# Patient Record
Sex: Female | Born: 1990 | Race: Asian | Hispanic: No | Marital: Married | Smoking: Never smoker
Health system: Southern US, Community
[De-identification: ages and names within clinical notes are randomized; demographics above are authoritative.]

## PROBLEM LIST (undated history)

## (undated) ENCOUNTER — Inpatient Hospital Stay: Payer: Self-pay

## (undated) DIAGNOSIS — K219 Gastro-esophageal reflux disease without esophagitis: Secondary | ICD-10-CM

## (undated) DIAGNOSIS — Z789 Other specified health status: Secondary | ICD-10-CM

## (undated) HISTORY — DX: Other specified health status: Z78.9

---
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
## 2014-09-03 ENCOUNTER — Encounter: Payer: Self-pay | Admitting: Maternal & Fetal Medicine

## 2014-11-01 ENCOUNTER — Inpatient Hospital Stay
Admit: 2014-11-01 | Disposition: A | Discharge status: Home / Self Care | Payer: Self-pay | Attending: Obstetrics and Gynecology | Admitting: Obstetrics and Gynecology

## 2014-11-01 LAB — CBC WITH DIFFERENTIAL/PLATELET
BASOS ABS: 0 10*3/uL (ref 0.0–0.1)
Basophil %: 0.3 %
EOS ABS: 0.2 10*3/uL (ref 0.0–0.7)
Eosinophil %: 1.5 %
HCT: 38.4 % (ref 35.0–47.0)
HGB: 13.1 g/dL (ref 12.0–16.0)
Lymphocyte #: 2.3 10*3/uL (ref 1.0–3.6)
Lymphocyte %: 22.3 %
MCH: 28.9 pg (ref 26.0–34.0)
MCHC: 34 g/dL (ref 32.0–36.0)
MCV: 85 fL (ref 80–100)
Monocyte #: 0.7 x10 3/mm (ref 0.2–0.9)
Monocyte %: 7.2 %
Neutrophil #: 7 10*3/uL — ABNORMAL HIGH (ref 1.4–6.5)
Neutrophil %: 68.7 %
PLATELETS: 253 10*3/uL (ref 150–440)
RBC: 4.51 10*6/uL (ref 3.80–5.20)
RDW: 12.9 % (ref 11.5–14.5)
WBC: 10.1 10*3/uL (ref 3.6–11.0)

## 2014-11-01 LAB — GC/CHLAMYDIA PROBE AMP

## 2014-11-02 LAB — HEMATOCRIT: HCT: 34.1 % — AB (ref 35.0–47.0)

## 2014-11-08 NOTE — Op Note (Addendum)
PATIENT NAME:  Sabrina Little, Sabrina Little MR#:  [id number] DATE OF BIRTH:  07/06/1991 VISIT#:  [id number].  DATE OF PROCEDURE:  11/01/2014.  PREOPERATIVE DIAGNOSES:  1. Intrauterine pregnancy at 40-0/7 weeks' gestational age.  2. History of prior cesarean delivery.  3. Nonreassuring antenatal testing.    POSTOPERATIVE DIAGNOSES:   1. Intrauterine pregnancy at 40-0/7 weeks' gestational age.  2. History of prior cesarean delivery.  3. Nonreassuring antenatal testing.    PROCEDURE PERFORMED:  Repeat low transverse cesarean section via Pfannenstiel incision.   ANESTHESIA:  Spinal.   SURGEON:  Conard Novak, MD.   ESTIMATED BLOOD LOSS:  750 mL.   OPERATIVE FLUIDS:  1400 mL of crystalloid.   COMPLICATIONS:  None.   FINDINGS:  1. Dense adhesions of right anterior uterus to right abdominal wall and right bladder.  2. Filmy adhesions of bladder to lower uterine segment.  3. Otherwise normal-appearing gravid uterus, fallopian tubes, and ovaries.  4. Viable female infant with Apgars of 8 at 1 minute and 9 at 5 minutes with a weight of 3580 grams.   SPECIMENS:  None.   CONDITION AT THE END OF PROCEDURE:  Stable.   PROCEDURE IN DETAIL:  The patient was taken to the operating room where spinal analgesia was administered and found to be adequate.  The patient was placed in the dorsal supine position with a leftward tilt and prepped and draped in the usual sterile fashion.  After a timeout was called, a Pfannenstiel incision was made through a prior incision scar and carried through the various layers until the peritoneum was identified and opened sharply.  The peritoneal opening was extended; however, it was noticed that the right lower anterior aspect of the uterus was adherent to both the overlying abdominal wall as well as the right segment of the bladder in a fairly dense fashion.  There was also noted to be some filmy adhesions of the bladder to the lower uterine segment which were taken down with  careful dissection using Metzenbaum scissors.  After the bladder flap was created, a low transverse hysterotomy was made with a scalpel bearing just left of the adhesions to the right side of the bladder and was extended laterally with cranial and caudal tension.  The fetal vertex was grasped and elevated to the hysterotomy and delivered with fundal pressure followed by the shoulders and the rest of the body.  Given the size of the opening had to be somewhat reduced, the right arm had to be delivered prior to delivery of the left shoulder in order to deliver the shoulders without excessive traction on the fetal neck.  After delivery of both shoulders, the rest of the body followed without difficulty.  The cord was clamped and cut, and the infant was handed to the pediatricians.  The placenta was removed spontaneously intact with a 3-vessel cord.  The uterus was then able to be exteriorized and cleared of all clots and debris.  The hysterotomy was closed using #0 Vicryl in a running locked fashion.  A second layer of the same suture was used in an imbricating fashion to obtain hemostasis.  The uterus was returned to the abdomen, and the gutters were cleared of all clots and debris.  The rectus muscles were gently reapproximated using a horizontal mattress stitch.  After verification of hemostasis on the rectus muscles, the On-Q catheters were placed.   The catheters were placed according to the manufacturer's recommendations.  They were inserted approximately 4 cm cephalad to the  incision line, approximately 1 cm apart, straddling the midline.  They were inserted to a depth of approximately the fourth marking on the catheters. They were positioned just superficial to the rectus abdominis muscles and just deep to the rectus fascia.   The rectus fascia was closed using #1-0 looped PDS in a running fashion.  Great care was taken to exclude the On-Q catheters from the closure.  The subcutaneous tissue was then  irrigated and hemostasis was noted.  Subcutaneous tissue was closed using #2-0 plain gut such that no greater than 2 cm of dead space remained.  The skin was closed using 4-0 Monocryl in a subcuticular fashion.  This closure was reinforced using Dermabond.   The On-Q catheters were affixed to the skin using Dermabond as well as Steri-Strips and Tegaderm.  Each catheter was bolused with 5 mL of 0.5% Marcaine plain for a total of 10 mL.   The patient tolerated the procedure well.  Sponge, lap, and needle counts were correct x 2.  For antibiotic prophylaxis, she received 2 grams of Ancef prior to skin incision.  For VTE prophylaxis, she was wearing pneumatic compression stockings which were on and operating throughout the entire procedure.  She was taken to the recovery area at the end of the procedure in stable condition.     ____________________________ Conard NovakStephen D. Shivonne Schwartzman, MD sdj:kc D: 11/01/2014 18:18:19 ET T: 11/01/2014 19:25:47 ET JOB#: [id number]  cc: Conard NovakStephen D. Takumi Din, MD, <Dictator> Conard NovakSTEPHEN D Slyvia Lartigue MD ELECTRONICALLY SIGNED 11/07/2014 17:57

## 2014-11-08 NOTE — Consult Note (Signed)
Referral Information:  Reason for Referral 24 yo gravida 3 para 1011 at 6045w4d gestation by LMP and 12-13w US is referred for discussion of risks of uterine rupture with TOLAC.   Referring Physician [facility]   Prenatal Hx Uncomplicated   Past Obstetrical Hx 08/18/2012 - Documented LTCS with 2 cm R inferior extension.  7 lb 13.2 oz female infant.  Repaired with #1 chromic running locked suture.  Indication was nonreassuring FHR.  Was 4-5 cm.   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   Occupation Father Works in [employer]   Soc Hx married, No substances   Review Of Systems:  Subjective Negative   Fever/Chills No   Cough No   Abdominal Pain No   Diarrhea No   Constipation No   Nausea/Vomiting No   SOB/DOE No   Chest Pain No   Dysuria No   Tolerating Diet Yes   Medications/Allergies Reviewed Medications/Allergies reviewed   Exam:  Today's Weight Ht 5'2"-196lb    Additional Lab/Radiology Notes BMI = 36   Impression/Recommendations:  Impression 24 yo gravida 3 para 1011 at 7345w4d gestation by LMP and 12-13w US with: Obesity (BMI = 36) History of LTCS for NRFHT at 4-5 cm - incision closed with (single) running locked suture   Recommendations The patient was counseled that:  1.  Current evidence  is insufficient to conclude about the risk of uterine rupture based on the  history of a single-layer or double-layer closure. Single-layer closure and locked first layer are possibly coupled with thinner residual myometrium thickness.  Current evidence based on randomized trials does not support a specific type of uterine closure for optimal maternal outcomes.  Carney Harder(Roberge et al American Journal of Obstetrics and Gynecology, Volume 212, Issue 6, June 2015, Page 829).  The patient was counseled that her overall risk of uterine rupture is equal to or less than 1%. 2. Using the NIH Maternal-Fetal Medicine Units Network VBAC Calculator, the patient has a 2 out of 3 chance of a successful VBAC (68.5% [64.9-72.0%]) 3.  As to the length of gestation, we would recommend she be delivered no later than [redacted] weeks gestation - our recommendation for all patients without significant additional risk factors. 4.  Given her history of cesarean delivery and her obesity, I would suggest an ultrasound for growth at [redacted] weeks gestation. 5.  We can concur that she should not undergo induction of labor (unless by ROM).   Plan:  Ultrasound at what gestational ages 8036 weeks gestation   Delivery at what gestational age [redacted] weeks if undelivered.   Comment/Plan Thank you for allowing us to participate in her care.    Total Time Spent with Patient 45 minutes   >50% of visit spent in couseling/coordination of care yes   Office Use Only 99243  Level 3 (40min)  NEW office consult detailed   Coding Description: MATERNAL CONDITIONS/HISTORY INDICATION(S).   Previous C-section.  Electronic Signatures: Lady Deutscher (MD)  (Signed 03-Mar-16 13:05)  Authored: Referral, Home Medications, Vital Signs/Notes, Consult, Exam, Lab/Radiology Notes, Impression, Plan, Billing, Coding Description   Last Updated: 03-Mar-16 13:05 by Lady Deutscher (MD)

## 2014-11-17 NOTE — H&P (Signed)
L&D Evaluation:  History Expanded:  HPI 24 yo G3P1011 at 3342w0d gestational age by LMP consistent with 12-13 week ultrasound. Her pregnancy is complicated by a history of prior emergent [place].  She has been counseled extensively regarding attempted VBAC versus repeat cesarean delivery. [facility] and [facility].  She had elected to attempt VBAC unless no labor by 441 week gestational age, at which time she is scheduled to undergo a repeat cesarean delivery.  She presented today with regular uterine contractions becoming stronger.  She notes positive fetal movement, no leakage of fluid, and no vaginal bleeding.  While being observed during a labor evalution, she had a spontaneous deceleration lasting several minutes with gradual return to baseline.   Blood Type (Maternal) A positive   Group B Strep Results Maternal (Result >5wks must be treated as unknown) positive   Maternal HIV Negative   Maternal Syphilis Ab Nonreactive   Maternal Varicella Immune   Rubella Results (Maternal) immune   [facility]   Patient's Medical History No Chronic Illness   Patient's Surgical History Previous C-Section  Tonsillectomy   Medications Pre Natal Vitamins   Allergies NKDA   Social History none  denies tobacco, EtOH, and illicit drug use   Family History Non-Contributory   ROS:  ROS All systems were reviewed.  HEENT, CNS, GI, GU, Respiratory, CV, Renal and Musculoskeletal systems were found to be normal., unless otherwise noted in HPI   Exam:  Vital Signs T36.9C, P94, BP 108/56, RR 16   General no apparent distress   Mental Status clear   Chest clear   Heart normal sinus rhythm   Abdomen gravid, non-tender   Estimated Fetal Weight 8 pounds   Fetal Position cephalic   Back no CVAT   Edema no edema   Pelvic no external lesions, 1cm per RN   Mebranes Intact   FHT 125/mod var/+accels/+DECEL to 60s with 3 min from baseline  to baseline   Ucx irregular   Skin no lesions   Impression:  Impression 1) Intrauterine pregnancy at 2942w0d gestational age, 2) history of prior cesarean delivery, 3) non-reassuring antenatal testing with a positive spontaneous contraction stress test   Plan:  Comments 1) Labor: Given non-reassuring antenatal testing with deceleration to 60s, had a long discussion with the patient and her husband regarding ongoing management. Discussed risks of continuing with attempted labor (thoug she is not laboring now) versus a repeat cesarean delivery.  Mutual decision made to proceed with repeat cesarean delivery given fetal tracing.   2) Fetus - category I tracing  currently, but guarded given prolonged deceleration in setting of prior cesarean delivery.  3) PNL A positive / ABSC negative / RI / VZI / HIV neg / RPR NR / HBsAg neg / 1st trimester screen negative / passed early 1h gtt,  passed 28 wk 3h gtt with one abnormal / GBS positive / total weight gain this pregnancy = 14lb 4) TDAP  given 08/20/14 5) Disposition - home postpartum   Electronic Signatures: Conard NovakJackson, Ibraheem Voris D (MD)  (Signed 24-Apr-16 16:06)  Authored: L&D Evaluation   Last Updated: 24-Apr-16 16:06 by Conard NovakJackson, Mahamadou Weltz D (MD)

## 2015-07-11 NOTE — L&D Delivery Note (Signed)
Delivery Note At  2320 a viable and but floppy female "Maverick" was delivered via  (Presentation:ROP ;  ).  APGAR:1 ,8 ; weight  .  Cord cut immediatley and infant to warmer t for stimulation PPV started at 90 secounds as HR down to 60s with gradual response over 90 seconds, grimacing and breathing efforts noted, color improved with O2. Neonatology in at 4 minute and assumed care. Terminal meconium. Placenta status: delivered intact with 3 vessel  Cord:  with the following complications: early variable with third stage of labor  Cord pH: awaiting  Anesthesia:  epidural Episiotomy:  none Lacerations:  partial 3rd Suture Repair: 3.0 vicryl rapide Est. Blood Loss (mL):  300  Mom to postpartum.  Baby to Nursery. For transition and observation  [employer] MicrosystemsMelody N Shambley, CNM 05/12/2016, 11:44 PM

## 2015-11-17 ENCOUNTER — Encounter: Payer: Self-pay | Admitting: Obstetrics and Gynecology

## 2015-11-17 ENCOUNTER — Ambulatory Visit (INDEPENDENT_AMBULATORY_CARE_PROVIDER_SITE_OTHER): Payer: Medicaid Other | Admitting: Obstetrics and Gynecology

## 2015-11-17 VITALS — BP 104/63 | HR 79 | Ht 62.0 in | Wt 186.6 lb

## 2015-11-17 DIAGNOSIS — Z98891 History of uterine scar from previous surgery: Secondary | ICD-10-CM

## 2015-11-17 DIAGNOSIS — O219 Vomiting of pregnancy, unspecified: Secondary | ICD-10-CM

## 2015-11-17 DIAGNOSIS — Z3492 Encounter for supervision of normal pregnancy, unspecified, second trimester: Secondary | ICD-10-CM | POA: Diagnosis not present

## 2015-11-17 DIAGNOSIS — R638 Other symptoms and signs concerning food and fluid intake: Secondary | ICD-10-CM

## 2015-11-17 DIAGNOSIS — Z8759 Personal history of other complications of pregnancy, childbirth and the puerperium: Secondary | ICD-10-CM

## 2015-11-17 MED ORDER — PROMETHAZINE HCL 25 MG PO TABS: 25.0000 mg | ORAL_TABLET | Freq: Four times a day (QID) | ORAL | Status: DC | PRN

## 2015-11-17 NOTE — Progress Notes (Signed)
NEW OB HISTORY AND PHYSICAL  SUBJECTIVE:       Sabrina Little is a 25 y.o. [phone] female, Patient's last menstrual period was 07/31/2015 (exact date)., Estimated Date of Delivery: 05/06/16, [redacted]w[redacted]d, presents today for establishment of Prenatal Care. She has no unusual complaints and complains of headache, nausea with vomiting, sciatic pain on the right side, and dizziness.  Patient is currently taking prenatal vitamins, was not at time of conception.    Gynecologic History Patient's last menstrual period was 07/31/2015 (exact date). Normal Contraception: none Last Pap: 2015. Results were: normal  Obstetric History OB History  Gravida Para Term Preterm AB SAB TAB Ectopic Multiple Living  # Outcome Date GA Lbr Len/2nd Weight Sex Delivery Anes PTL Lv  5 Current           4 Term 2016   7 lb 2.2 oz (3.239 kg) M CS-LTranv   Y  3 SAB 2015          2 Term 2014   7 lb 2.1 oz (3.234 kg) M CS-LTranv   Y  1 SAB 2013              Past Medical History  Diagnosis Date  . Medical history non-contributory     Past Surgical History  Procedure Laterality Date  . Cesarean section      x2  . Tonsillectomy  2009    No current outpatient prescriptions on file prior to visit.   No current facility-administered medications on file prior to visit.    No Known Allergies  Social History   Social History  . Marital Status: Married    Spouse Name: N/A  . Number of Children: N/A  . Years of Education: N/A   Occupational History  . Not on file.   Social History Main Topics  . Smoking status: Never Smoker   . Smokeless tobacco: Not on file  . Alcohol Use: No  . Drug Use: No  . Sexual Activity: Yes    Birth Control/ Protection: None   Other Topics Concern  . Not on file   Social History Narrative  . No narrative on file    Family History  Problem Relation Age of Onset  . Cancer Neg Hx   . Diabetes Neg Hx   . Heart disease Neg Hx     The following  portions of the patient's history were reviewed and updated as appropriate: allergies, current medications, past OB history, past medical history, past surgical history, past family history, past social history, and problem list.    OBJECTIVE: Initial Physical Exam (New OB)  GENERAL APPEARANCE: alert, well appearing, in no apparent distress, overweight HEAD: normocephalic, atraumatic MOUTH: mucous membranes moist, pharynx normal without lesions THYROID: no thyromegaly or masses present BREASTS: no masses noted, no significant tenderness, no palpable axillary nodes, no skin changes LUNGS: clear to auscultation, no wheezes, rales or rhonchi, symmetric air entry HEART: regular rate and rhythm, no murmurs ABDOMEN: soft, nontender, nondistended, no abnormal masses, no epigastric pain, obese, fundus soft, nontender 17 weeks size and FHT found on [place] to be 153 bpm EXTREMITIES: no redness or tenderness in the calves or thighs, no edema, no limitation in range of motion, intact peripheral pulses SKIN: normal coloration and turgor, no rashes LYMPH NODES: no adenopathy palpable NEUROLOGIC: alert, oriented, normal speech, no focal findings or movement disorder noted  PELVIC EXAM EXTERNAL GENITALIA: normal appearing vulva  with no masses, tenderness or lesions VAGINA: discharge white in color CERVIX: no lesions or cervical motion tenderness and closed UTERUS: gravid, consistent with 17 weeks and anteverted ADNEXA: no masses palpable and nontender OB EXAM PELVIMETRY: NA  ASSESSMENT: Normal pregnancy 15.4 weeks Increased BMI Prior C/S x 2 N/V of pregnancy Current risk factors include: BMI of 34, prior cesarean x2  PLAN: 1. US to find FHT today - FHT 153 bpm 2. Pap done today. 3. Prenatal Labs. 4. Early glucola screening due to obesity. 5. Schedule anatomy scan for 19-20 weeks. 6. Phenergan for nausea. 7. New OB counseling: The patient has been given an overview regarding routine prenatal  care. Recommendations regarding diet, weight gain, and exercise in pregnancy were given. Prenatal testing, optional genetic testing, and ultrasound use in pregnancy were reviewed.  Benefits of Breast Feeding were discussed. The patient is encouraged to consider nursing her baby post partum.   Tresa EndoKelly Rayburn PA-S Herold HarmsMartin A Laycie Schriner, MD   I have seen, interviewed, and examined the patient in conjunction with the [employer] student and affirm the diagnosis and management plan. Jayelyn Barno A. Yahya Boldman, MD, FACOG   Note: This dictation was prepared with Dragon dictation along with smaller phrase technology. Any transcriptional errors that result from this process are unintentional.

## 2015-11-18 ENCOUNTER — Telehealth: Payer: Self-pay

## 2015-11-18 ENCOUNTER — Encounter: Payer: Self-pay | Admitting: Obstetrics and Gynecology

## 2015-11-18 DIAGNOSIS — O99810 Abnormal glucose complicating pregnancy: Secondary | ICD-10-CM | POA: Insufficient documentation

## 2015-11-18 DIAGNOSIS — R7309 Other abnormal glucose: Secondary | ICD-10-CM

## 2015-11-18 LAB — ABO AND RH: RH TYPE: POSITIVE

## 2015-11-18 LAB — GLUCOSE, 1 HOUR GESTATIONAL: GESTATIONAL DIABETES SCREEN: 160 mg/dL — AB (ref 65–139)

## 2015-11-18 LAB — CBC WITH DIFFERENTIAL/PLATELET
BASOS: 0 %
Basophils Absolute: 0 10*3/uL (ref 0.0–0.2)
EOS (ABSOLUTE): 0.1 10*3/uL (ref 0.0–0.4)
Eos: 2 %
Hematocrit: 37.1 % (ref 34.0–46.6)
Hemoglobin: 12.6 g/dL (ref 11.1–15.9)
IMMATURE GRANULOCYTES: 0 %
Immature Grans (Abs): 0 10*3/uL (ref 0.0–0.1)
LYMPHS ABS: 1.5 10*3/uL (ref 0.7–3.1)
Lymphs: 21 %
MCH: 28.3 pg (ref 26.6–33.0)
MCHC: 34 g/dL (ref 31.5–35.7)
MCV: 83 fL (ref 79–97)
MONOS ABS: 0.3 10*3/uL (ref 0.1–0.9)
Monocytes: 4 %
NEUTROS PCT: 73 %
Neutrophils Absolute: 5.3 10*3/uL (ref 1.4–7.0)
PLATELETS: 223 10*3/uL (ref 150–379)
RBC: 4.45 x10E6/uL (ref 3.77–5.28)
RDW: 13.6 % (ref 12.3–15.4)
WBC: 7.3 10*3/uL (ref 3.4–10.8)

## 2015-11-18 LAB — VARICELLA ZOSTER ANTIBODY, IGG: VARICELLA: 620 {index} (ref >165)

## 2015-11-18 LAB — HEPATITIS B SURFACE ANTIGEN: Hepatitis B Surface Ag: NEGATIVE

## 2015-11-18 LAB — RUBELLA SCREEN: Rubella Antibodies, IGG: 4.67 index (ref >0.99)

## 2015-11-18 LAB — TOXOPLASMA ANTIBODIES- IGG AND  IGM: Toxoplasma Antibody- IgM: <3 AU/mL (ref 0.0–7.9)

## 2015-11-18 LAB — ANTIBODY SCREEN: Antibody Screen: NEGATIVE

## 2015-11-18 LAB — RPR: RPR Ser Ql: NONREACTIVE

## 2015-11-18 LAB — HIV ANTIBODY (ROUTINE TESTING W REFLEX): HIV Screen 4th Generation wRfx: NONREACTIVE

## 2015-11-18 NOTE — Telephone Encounter (Signed)
-----   Message from Herold HarmsMartin A Defrancesco, MD sent at 11/18/2015  8:12 AM EDT ----- Please notify - Abnormal Labs 1 hour Glucola elevated at 160; please schedule 3 hour GTT

## 2015-11-18 NOTE — Telephone Encounter (Signed)
Pt aware. 3 hour scheduled for 5/22 8am. Pt aware to be fasting.

## 2015-11-19 LAB — MONITOR DRUG PROFILE 14(MW)
Amphetamine Scrn, Ur: NEGATIVE ng/mL
BARBITURATE SCREEN URINE: NEGATIVE ng/mL
BENZODIAZEPINE SCREEN, URINE: NEGATIVE ng/mL
BUPRENORPHINE, URINE: NEGATIVE ng/mL
CANNABINOIDS UR QL SCN: NEGATIVE ng/mL
COCAINE(METAB.)SCREEN, URINE: NEGATIVE ng/mL
CREATININE(CRT), U: 47.5 mg/dL (ref 20.0–300.0)
Fentanyl, Urine: NEGATIVE pg/mL
MEPERIDINE SCREEN, URINE: NEGATIVE ng/mL
Methadone Screen, Urine: NEGATIVE ng/mL
OXYCODONE+OXYMORPHONE UR QL SCN: NEGATIVE ng/mL
Opiate Scrn, Ur: NEGATIVE ng/mL
PHENCYCLIDINE QUANTITATIVE URINE: NEGATIVE ng/mL
Ph of Urine: 5.9 (ref 4.5–8.9)
Propoxyphene Scrn, Ur: NEGATIVE ng/mL
SPECIFIC GRAVITY: 1.014
Tramadol Screen, Urine: NEGATIVE ng/mL

## 2015-11-19 LAB — URINALYSIS, ROUTINE W REFLEX MICROSCOPIC
Bilirubin, UA: NEGATIVE
GLUCOSE, UA: NEGATIVE
Ketones, UA: NEGATIVE
Leukocytes, UA: NEGATIVE
NITRITE UA: NEGATIVE
Protein, UA: NEGATIVE
RBC, UA: NEGATIVE
Specific Gravity, UA: 1.012 (ref 1.005–1.030)
Urobilinogen, Ur: 0.2 mg/dL (ref 0.2–1.0)
pH, UA: 6 (ref 5.0–7.5)

## 2015-11-19 LAB — NICOTINE SCREEN, URINE: Cotinine Ql Scrn, Ur: NEGATIVE ng/mL

## 2015-11-20 LAB — PAP IG, CT-NG, RFX HPV ASCU
Chlamydia, Nuc. Acid Amp: NEGATIVE
Gonococcus by Nucleic Acid Amp: NEGATIVE
PAP SMEAR COMMENT: 0

## 2015-11-22 LAB — CULTURE, OB URINE

## 2015-11-22 LAB — URINE CULTURE, OB REFLEX

## 2015-11-24 ENCOUNTER — Other Ambulatory Visit: Payer: Medicaid Other

## 2015-11-24 ENCOUNTER — Other Ambulatory Visit: Payer: Self-pay | Admitting: Obstetrics and Gynecology

## 2015-11-26 ENCOUNTER — Encounter: Payer: Self-pay | Admitting: Obstetrics and Gynecology

## 2015-11-26 DIAGNOSIS — R8271 Bacteriuria: Secondary | ICD-10-CM | POA: Insufficient documentation

## 2015-11-27 LAB — AFP, QUAD SCREEN
DIA MOM VALUE: 0.91
DIA Value (EIA): 141.87 pg/mL
DSR (By Age)    1 IN: 1094
DSR (SECOND TRIMESTER) 1 IN: 4243
Gestational Age: 16.4 WEEKS
MSAFP Mom: 0.86
MSAFP: 25.9 ng/mL
MSHCG Mom: 2.27
MSHCG: 74376 m[IU]/mL
Maternal Age At EDD: 23.3 YEARS
Osb Risk: 10000
T18 (By Age): 1:4262 {titer}
Test Results:: NEGATIVE
WEIGHT: 186 [lb_av]
uE3 Mom: 0.86
uE3 Value: 0.74 ng/mL

## 2015-11-29 ENCOUNTER — Other Ambulatory Visit: Payer: Medicaid Other

## 2015-11-29 ENCOUNTER — Telehealth: Payer: Self-pay

## 2015-11-29 NOTE — Telephone Encounter (Signed)
Pt aware.

## 2015-11-29 NOTE — Telephone Encounter (Signed)
-----   Message from Herold HarmsMartin A Defrancesco, MD sent at 11/29/2015 10:41 AM EDT ----- Please Notify - Labs normal Test is negative for open spina bifida, Down syndrome, and trisomy 18

## 2015-12-13 ENCOUNTER — Other Ambulatory Visit: Payer: Medicaid Other

## 2015-12-14 ENCOUNTER — Ambulatory Visit (INDEPENDENT_AMBULATORY_CARE_PROVIDER_SITE_OTHER): Payer: Medicaid Other | Admitting: Obstetrics and Gynecology

## 2015-12-14 ENCOUNTER — Encounter: Payer: Self-pay | Admitting: Obstetrics and Gynecology

## 2015-12-14 ENCOUNTER — Ambulatory Visit (INDEPENDENT_AMBULATORY_CARE_PROVIDER_SITE_OTHER): Payer: Medicaid Other

## 2015-12-14 VITALS — BP 109/68 | HR 75 | Wt 185.2 lb

## 2015-12-14 DIAGNOSIS — Z8759 Personal history of other complications of pregnancy, childbirth and the puerperium: Secondary | ICD-10-CM | POA: Diagnosis not present

## 2015-12-14 DIAGNOSIS — Z1389 Encounter for screening for other disorder: Secondary | ICD-10-CM

## 2015-12-14 DIAGNOSIS — Z369 Encounter for antenatal screening, unspecified: Secondary | ICD-10-CM

## 2015-12-14 DIAGNOSIS — Z36 Encounter for antenatal screening of mother: Secondary | ICD-10-CM

## 2015-12-14 DIAGNOSIS — Z98891 History of uterine scar from previous surgery: Secondary | ICD-10-CM

## 2015-12-14 DIAGNOSIS — Z3492 Encounter for supervision of normal pregnancy, unspecified, second trimester: Secondary | ICD-10-CM

## 2015-12-14 DIAGNOSIS — M5431 Sciatica, right side: Secondary | ICD-10-CM

## 2015-12-14 DIAGNOSIS — M5430 Sciatica, unspecified side: Secondary | ICD-10-CM | POA: Insufficient documentation

## 2015-12-14 LAB — POCT URINALYSIS DIPSTICK
Bilirubin, UA: NEGATIVE
Blood, UA: NEGATIVE
Glucose, UA: NEGATIVE
KETONES UA: NEGATIVE
LEUKOCYTES UA: NEGATIVE
Nitrite, UA: NEGATIVE
PH UA: 6
PROTEIN UA: NEGATIVE
SPEC GRAV UA: 1.01
Urobilinogen, UA: NEGATIVE

## 2015-12-14 NOTE — Progress Notes (Signed)
Indications:Anatomy U/S Findings:  Singleton intrauterine pregnancy is visualized with FHR at 157 BPM. Biometrics give an (U/S) Gestational age of 23 4/7 weeks and an (U/S) EDD of 05/12/16; this correlates with the clinically established EDD of 05/06/16.  Fetal presentation is Vertex.  EFW: 245g (9oz). Placenta: posterior plac, grade0, remote to cervix. AFI: Adequate with MVP of 4.1cm.  Anatomic survey is complete and normal; Gender - female  .   Right Ovary measures 3.1 x 1.5 x 2 cm. It is normal in appearance. Left Ovary measures 2.2 x 1 x 1.6 cm. It is normal appearance. There is evidence of a corpus luteal cyst in the Right ovary. Survey of the adnexa demonstrates no adnexal masses. There is no free peritoneal fluid in the cul de sac.  Impression: 1. 18 4/7 week Viable Singleton Intrauterine pregnancy by U/S. 2. (U/S) EDD is consistent with Clinically established (LMP) EDD of 05/06/16. 3. Normal Anatomy Scan  C/O headaches- OK to try tylenol with caffeine; also having right sciatic pains- recommend chiropractor evaluation

## 2015-12-17 ENCOUNTER — Encounter: Payer: Self-pay | Admitting: Obstetrics and Gynecology

## 2015-12-17 ENCOUNTER — Other Ambulatory Visit: Payer: Medicaid Other

## 2015-12-17 DIAGNOSIS — R7309 Other abnormal glucose: Secondary | ICD-10-CM

## 2015-12-18 LAB — GESTATIONAL GLUCOSE TOLERANCE
GLUCOSE 1 HOUR GTT: 149 mg/dL (ref 65–179)
GLUCOSE 2 HOUR GTT: 98 mg/dL (ref 65–154)
GLUCOSE 3 HOUR GTT: 82 mg/dL (ref 65–139)
Glucose, Fasting: 83 mg/dL (ref 65–94)

## 2015-12-24 ENCOUNTER — Telehealth: Payer: Self-pay | Admitting: Obstetrics and Gynecology

## 2015-12-24 DIAGNOSIS — O219 Vomiting of pregnancy, unspecified: Secondary | ICD-10-CM

## 2015-12-24 MED ORDER — PROMETHAZINE HCL 25 MG PO TABS: 25.0000 mg | ORAL_TABLET | Freq: Four times a day (QID) | ORAL | Status: DC | PRN

## 2015-12-24 NOTE — Telephone Encounter (Signed)
Pt is [redacted] wks pregnant// Fever 100/ congestion, cough sore throat,sneezing, bad body aches, headaches, dizzy

## 2015-12-24 NOTE — Telephone Encounter (Signed)
Called pt she states that she has had a bad cold x 2 days. Pt c/o runny nose, headache, dizziness, and fever. Pt states she has only tried Tylenol for relief. Advised pt to continue Tylenol every 4-6 hours as needed for fever, plain Robitussin for cough, push fluids (recommeded Gatorade or Pedialyte as she cannot tolerate water) also sent in refill of phenergan as she has some nausea. Advised pt that if sx worsen to seek care in the ED, call back Monday otherwise of no improvement. Pt gave verbal understanding. Rx sent in.

## 2015-12-25 ENCOUNTER — Other Ambulatory Visit: Payer: Self-pay

## 2015-12-25 ENCOUNTER — Emergency Department: Payer: Medicaid Other

## 2015-12-25 ENCOUNTER — Inpatient Hospital Stay
Admission: EM | Admit: 2015-12-25 | Discharge: 2015-12-25 | Disposition: A | Discharge status: Home / Self Care | Payer: Medicaid Other | Attending: Emergency Medicine | Admitting: Emergency Medicine

## 2015-12-25 DIAGNOSIS — R509 Fever, unspecified: Secondary | ICD-10-CM | POA: Insufficient documentation

## 2015-12-25 DIAGNOSIS — Z3A21 21 weeks gestation of pregnancy: Secondary | ICD-10-CM | POA: Diagnosis not present

## 2015-12-25 DIAGNOSIS — O99512 Diseases of the respiratory system complicating pregnancy, second trimester: Secondary | ICD-10-CM | POA: Insufficient documentation

## 2015-12-25 DIAGNOSIS — J069 Acute upper respiratory infection, unspecified: Secondary | ICD-10-CM | POA: Insufficient documentation

## 2015-12-25 DIAGNOSIS — O26892 Other specified pregnancy related conditions, second trimester: Secondary | ICD-10-CM | POA: Insufficient documentation

## 2015-12-25 DIAGNOSIS — O200 Threatened abortion: Secondary | ICD-10-CM | POA: Insufficient documentation

## 2015-12-25 DIAGNOSIS — O4692 Antepartum hemorrhage, unspecified, second trimester: Secondary | ICD-10-CM | POA: Diagnosis present

## 2015-12-25 LAB — COMPREHENSIVE METABOLIC PANEL
ALT: 12 U/L — AB (ref 14–54)
AST: 21 U/L (ref 15–41)
Albumin: 3.5 g/dL (ref 3.5–5.0)
Alkaline Phosphatase: 92 U/L (ref 38–126)
Anion gap: 11 (ref 5–15)
BILIRUBIN TOTAL: 0.3 mg/dL (ref 0.3–1.2)
BUN: 8 mg/dL (ref 6–20)
CO2: 20 mmol/L — ABNORMAL LOW (ref 22–32)
CREATININE: 0.53 mg/dL (ref 0.44–1.00)
Calcium: 8.7 mg/dL — ABNORMAL LOW (ref 8.9–10.3)
Chloride: 104 mmol/L (ref 101–111)
GFR calc Af Amer: >60 mL/min (ref >60)
Glucose, Bld: 100 mg/dL — ABNORMAL HIGH (ref 65–99)
POTASSIUM: 3.5 mmol/L (ref 3.5–5.1)
Sodium: 135 mmol/L (ref 135–145)
Total Protein: 7.3 g/dL (ref 6.5–8.1)

## 2015-12-25 LAB — CBC
HCT: 36 % (ref 35.0–47.0)
Hemoglobin: 12.3 g/dL (ref 12.0–16.0)
MCH: 28.8 pg (ref 26.0–34.0)
MCHC: 34 g/dL (ref 32.0–36.0)
MCV: 84.5 fL (ref 80.0–100.0)
PLATELETS: 235 10*3/uL (ref 150–440)
RBC: 4.27 MIL/uL (ref 3.80–5.20)
RDW: 13.5 % (ref 11.5–14.5)
WBC: 13.6 10*3/uL — ABNORMAL HIGH (ref 3.6–11.0)

## 2015-12-25 LAB — URINALYSIS COMPLETE WITH MICROSCOPIC (ARMC ONLY)
Bilirubin Urine: NEGATIVE
GLUCOSE, UA: NEGATIVE mg/dL
Hgb urine dipstick: NEGATIVE
Leukocytes, UA: NEGATIVE
Nitrite: NEGATIVE
Protein, ur: 30 mg/dL — AB
Specific Gravity, Urine: 1.028 (ref 1.005–1.030)
pH: 5 (ref 5.0–8.0)

## 2015-12-25 LAB — RAPID INFLUENZA A&B ANTIGENS (ARMC ONLY): INFLUENZA B (ARMC): NEGATIVE

## 2015-12-25 LAB — TROPONIN I

## 2015-12-25 LAB — HCG, QUANTITATIVE, PREGNANCY: HCG, BETA CHAIN, QUANT, S: 26428 m[IU]/mL — AB (ref <5)

## 2015-12-25 LAB — RAPID INFLUENZA A&B ANTIGENS: Influenza A (ARMC): NEGATIVE

## 2015-12-25 MED ORDER — SODIUM CHLORIDE 0.9 % IV BOLUS (SEPSIS)
1000.0000 mL | Freq: Once | INTRAVENOUS | Status: AC
  Administered 2015-12-25: 1000 mL via INTRAVENOUS

## 2015-12-25 MED ORDER — ACETAMINOPHEN 500 MG PO TABS
1000.0000 mg | ORAL_TABLET | Freq: Once | ORAL | Status: AC
  Administered 2015-12-25: 1000 mg via ORAL
  Filled 2015-12-25: qty 2

## 2015-12-25 NOTE — Discharge Instructions (Signed)
Your workup today has shown largely normal labs, normal chest x-ray. You diagnosis is most consistent with a viral upper respiratory infection. Please take Tylenol every 6 hours for discomfort or fever. Please drink plenty of fluids. Given your abdominal cramping with vaginal spotting this morning please proceed directly to labor and delivery as we have discussed.   Fever, Adult A fever is an increase in the body's temperature. It is usually defined as a temperature of 100F (38C) or higher. Brief mild or moderate fevers generally have no long-term effects, and they often do not require treatment. Moderate or high fevers may make you feel uncomfortable and can sometimes be a sign of a serious illness or disease. The sweating that may occur with repeated or prolonged fever may also cause dehydration. Fever is confirmed by taking a temperature with a thermometer. A measured temperature can vary with:  Age.  Time of day.  Location of the thermometer:  Mouth (oral).  Rectum (rectal).  Ear (tympanic).  Underarm (axillary).  Forehead (temporal). HOME CARE INSTRUCTIONS Pay attention to any changes in your symptoms. Take these actions to help with your condition:  Take over-the counter and prescription medicines only as told by your health care provider. Follow the dosing instructions carefully.  If you were prescribed an antibiotic medicine, take it as told by your health care provider. Do not stop taking the antibiotic even if you start to feel better.  Rest as needed.  Drink enough fluid to keep your urine clear or pale yellow. This helps to prevent dehydration.  Sponge yourself or bathe with room-temperature water to help reduce your body temperature as needed. Do not use ice water.  Do not overbundle yourself in blankets or heavy clothes. SEEK MEDICAL CARE IF:  You vomit.  You cannot eat or drink without vomiting.  You have diarrhea.  You have pain when you urinate.  Your  symptoms do not improve with treatment.  You develop new symptoms.  You develop excessive weakness. SEEK IMMEDIATE MEDICAL CARE IF:  You have shortness of breath or have trouble breathing.  You are dizzy or you faint.  You are disoriented or confused.  You develop signs of dehydration, such as a dry mouth, decreased urination, or paleness.  You develop severe pain in your abdomen.  You have persistent vomiting or diarrhea.  You develop a skin rash.  Your symptoms suddenly get worse.   This information is not intended to replace advice given to you by your health care provider. Make sure you discuss any questions you have with your health care provider.   Document Released: 12/20/2000 Document Revised: 03/17/2015 Document Reviewed: 08/20/2014 Elsevier Interactive Patient Education 2016 Elsevier Inc.  Cough, Adult A cough helps to clear your throat and lungs. A cough may last only 2-3 weeks (acute), or it may last longer than 8 weeks (chronic). Many different things can cause a cough. A cough may be a sign of an illness or another medical condition. HOME CARE  Pay attention to any changes in your cough.  Take medicines only as told by your doctor.  If you were prescribed an antibiotic medicine, take it as told by your doctor. Do not stop taking it even if you start to feel better.  Talk with your doctor before you try using a cough medicine.  Drink enough fluid to keep your pee (urine) clear or pale yellow.  If the air is dry, use a cold steam vaporizer or humidifier in your home.  Stay  away from things that make you cough at work or at home.  If your cough is worse at night, try using extra pillows to raise your head up higher while you sleep.  Do not smoke, and try not to be around smoke. If you need help quitting, ask your doctor.  Do not have caffeine.  Do not drink alcohol.  Rest as needed. GET HELP IF:  You have new problems (symptoms).  You cough up  yellow fluid (pus).  Your cough does not get better after 2-3 weeks, or your cough gets worse.  Medicine does not help your cough and you are not sleeping well.  You have pain that gets worse or pain that is not helped with medicine.  You have a fever.  You are losing weight and you do not know why.  You have night sweats. GET HELP RIGHT AWAY IF:  You cough up blood.  You have trouble breathing.  Your heartbeat is very fast.   This information is not intended to replace advice given to you by your health care provider. Make sure you discuss any questions you have with your health care provider.   Document Released: 03/09/2011 Document Revised: 03/17/2015 Document Reviewed: 09/02/2014 Elsevier Interactive Patient Education [employer]

## 2015-12-25 NOTE — ED Provider Notes (Addendum)
[facility] Emergency Department Provider Note  Time seen: 3:49 PM  I have reviewed the triage vital signs and the nursing notes.   HISTORY  Chief Complaint No chief complaint on file.    HPI Sabrina Little is a 25 y.o. female with no past medical history G3, P2, at [redacted] weeks pregnant who presents the emergency department with multiple complaints. According to the patient she's been feeling very "shaky" all day today. She is having intermittent chest pain, becoming dizzy especially upon standing. She states this morning she stood up and actually fell to the ground due to the dizziness/lightheaded.Denies landing on her abdomen. Patient states this morning she also had a small amount of vaginal bleeding while wiping. Denies any symptoms. Denies any dysuria. Denies nausea, vomiting, diarrhea. States for the past 2 days she has had URI-like symptoms with congestion and cough. Denies fever.     Past Medical History  Diagnosis Date  . Medical history non-contributory     Patient Active Problem List   Diagnosis Date Noted  . Sciatic nerve pain 12/14/2015  . GBS bacteriuria 11/26/2015  . Pregnancy with abnormal glucose tolerance test 11/18/2015  . Increased BMI 11/17/2015  . Nausea and vomiting during pregnancy 11/17/2015    Past Surgical History  Procedure Laterality Date  . Cesarean section      x2  . Tonsillectomy  2009    Current Outpatient Rx  Name  Route  Sig  Dispense  Refill  . Prenatal Vit-Fe Fumarate-FA (MULTIVITAMIN-PRENATAL) 27-0.8 MG TABS tablet   Oral   Take 1 tablet by mouth daily at 12 noon.         . promethazine (PHENERGAN) 25 MG tablet   Oral   Take 1 tablet (25 mg total) by mouth every 6 (six) hours as needed for nausea or vomiting.   30 tablet   1     Allergies Review of patient's allergies indicates no known allergies.  Family History  Problem Relation Age of Onset  . Cancer Neg Hx   . Diabetes Neg Hx   . Heart  disease Neg Hx     Social History Social History  Substance Use Topics  . Smoking status: Never Smoker   . Smokeless tobacco: Not on file  . Alcohol Use: No    Review of Systems Constitutional: Negative for fever. Cardiovascular: Negative for chest pain. Respiratory: Negative for shortness of breath. Gastrointestinal: Lower abdominal discomfort. Genitourinary: Negative for dysuria. Vaginal spotting this morning. Musculoskeletal: Negative for back pain Neurological: Negative for headache 10-point ROS otherwise negative.  ____________________________________________   PHYSICAL EXAM:  VITAL SIGNS: ED Triage Vitals  Enc Vitals Group     BP --      Pulse --      Resp --      Temp --      Temp src --      SpO2 --      Weight --      Height --      Head Cir --      Peak Flow --      Pain Score --      Pain Loc --      Pain Edu? --      Excl. in GC? --     Constitutional: Alert and oriented. Well appearing and in no distress. Eyes: Normal exam ENT   Head: Normocephalic and atraumatic.   Mouth/Throat: Mucous membranes are moist. Cardiovascular: Normal rate, regular rhythm. No murmur  Respiratory: Normal respiratory effort without tachypnea nor retractions. Breath sounds are clear  Gastrointestinal: Soft, mild left lower quadrant tenderness palpation. No rebound or guarding. No distention. Musculoskeletal: Nontender with normal range of motion in all extremities. No lower extremity tenderness or edema. Neurologic:  Normal speech and language. No gross focal neurologic deficits Skin:  Skin is warm, dry and intact.  Psychiatric: Mood and affect are normal.  ____________________________________________    EKG  EKG reviewed and interpreted by myself shows sinus tachycardia 127 bpm, narrow QRS, normal axis, normal intervals, nonspecific but no concerning ST changes. Overall reassuring EKG sinus  tachycardia.  ____________________________________________    INITIAL IMPRESSION / ASSESSMENT AND PLAN / ED COURSE  Pertinent labs & imaging results that were available during my care of the patient were reviewed by me and considered in my medical decision making (see chart for details).  Patient is [redacted] weeks pregnant presents with face complaints of feeling dizzy and shaky all day today. Had a fall earlier this morning but denies LOC. Denies hitting her abdomen. States she did have vaginal spotting at one point this morning but states none since. Denies dysuria. She does have mild left lower quadrant abdominal tenderness on exam. Patient is complaining of intermittent chest pain throughout the day today but denies any at this time. We'll check labs, EKG, IV hydrate, and closely monitor in the emergency department. Patient's blood pressure is normal, patient does have a temperature of 102 upon arrival.  Patient's labs are largely within normal limits. Patient remains tachycardic she is receiving a second liter of fluids, remains febrile at 101.6. Urinalysis is normal. Given the patient's fever tachycardia with cough we'll proceed with a chest x-ray with abdominal shielding, patient is agreeable to this plan. Overall the patient appears well, states she is feeling much better.  Chest x-ray negative. Patient now afebrile, heart rate around 120 bpm. Patient is feeling much better. We will discuss with labor and delivery for further monitoring of the patient given her mild lower abdominal discomfort and vaginal spotting this morning. Suspect the patient likely has a viral upper respiratory infection causing her symptoms. Continues to have occasional cough, states nasal congestion. I discussed supportive care at home including increasing fluids and using Tylenol for fever or discomfort.  I discussed the patient with labor and delivery, they would like the patient to be transferred upstairs for further  monitoring. We will discharge from the emergency department and bring directly to labor and delivery. Patient agreeable to plan.  ____________________________________________   FINAL CLINICAL IMPRESSION(S) / ED DIAGNOSES  Abdominal pain Lightheadedness Near-syncope Fall Chest pain Fever Upper respiratory infection  Minna AntisKevin Gene Glazebrook, MD 12/25/15 1916  Minna AntisKevin Phillis Thackeray, MD 12/25/15 [id number]

## 2015-12-25 NOTE — OB Triage Note (Signed)
Sent from ER for OB clearance for cramping, pt evaluated in ER for dizziness, chest pain and passing out, pt also seen for cold S/S.

## 2015-12-25 NOTE — ED Notes (Signed)
Pt bib EMS w/ c/o abd pain r/t fall, dizziness and fever.  Pt sts that she has been having cold s/s for 2 days as well as tingling/shaking in limbs that started today.  Pt sts she has had low grade fever.  Pt sts having dizziness/nausea w/ this pregnancy. Pt sts she fell today, hit R side and is having tenderness r/t fall. Pt sts fall not mechanical.  Alert, resp fast,  Shaking noted to limbs 101 CBG per EMS  Pt [id number] pregnant, 3rd pregnancy, no issue with other.

## 2015-12-25 NOTE — Progress Notes (Signed)
RN order to preform cervical exam and monitor for contractions for one hour and report back with CNM

## 2015-12-25 NOTE — ED Notes (Addendum)
Pt febrile, tachycardic, [redacted] weeks pregnant. Has had cough, congestion, sore throat, body aches, dizziness and fever x 2 days. States she had a syncopal episode today. Last had tylenol this am.

## 2015-12-25 NOTE — ED Notes (Addendum)
Pt will be transferred and taken to L&D for obs. Spoke with L&D and they want the iv left in.

## 2015-12-25 NOTE — Discharge Summary (Signed)
Patient discharged home, discharge instructions given, patient states understanding. Patient left floor in stable condition, denies any other needs at this time. Patient to keep next scheduled OB appointment 

## 2015-12-27 ENCOUNTER — Telehealth: Payer: Self-pay

## 2015-12-27 NOTE — Telephone Encounter (Signed)
Pt calls this am with cramping and abdominal pain. Had went to ER on Saturday for elevated heart rate of 130-140, trouble breathing. Pt sounds congested and cough noted. Pt states she was given 2 bags of fluid because of dehydration, and her x-ray was negative. They told her it was viral. She also had noted some spotting x1 on Saturday also but ER sent pt to L&D who said her cervix was closed and was not having any contractions. Pt on Sunday am continued with braxton hicks every 3-10 min. And back pain. Had 10 within an hour and back pain. This continues today. She wanted to come to office to have cervix checked but provider are in surgery today.  Pt to rest today, increase water intake and monitor abdominal pain. Pt encouraged to go back to ER/L&D if consistent contractions and/or vaginal bleeding today.

## 2015-12-28 ENCOUNTER — Telehealth: Payer: Self-pay | Admitting: *Deleted

## 2015-12-28 ENCOUNTER — Ambulatory Visit (INDEPENDENT_AMBULATORY_CARE_PROVIDER_SITE_OTHER): Payer: Medicaid Other | Admitting: Obstetrics and Gynecology

## 2015-12-28 VITALS — BP 100/62 | HR 98 | Wt 187.3 lb

## 2015-12-28 DIAGNOSIS — O9989 Other specified diseases and conditions complicating pregnancy, childbirth and the puerperium: Secondary | ICD-10-CM

## 2015-12-28 DIAGNOSIS — O26899 Other specified pregnancy related conditions, unspecified trimester: Secondary | ICD-10-CM

## 2015-12-28 DIAGNOSIS — O368121 Decreased fetal movements, second trimester, fetus 1: Secondary | ICD-10-CM

## 2015-12-28 DIAGNOSIS — R102 Pelvic and perineal pain: Secondary | ICD-10-CM

## 2015-12-28 DIAGNOSIS — N949 Unspecified condition associated with female genital organs and menstrual cycle: Secondary | ICD-10-CM

## 2015-12-28 LAB — POCT URINALYSIS DIPSTICK
Bilirubin, UA: NEGATIVE
Blood, UA: NEGATIVE
Glucose, UA: NEGATIVE
Ketones, UA: NEGATIVE
LEUKOCYTES UA: NEGATIVE
Nitrite, UA: NEGATIVE
Spec Grav, UA: 1.025
UROBILINOGEN UA: 2
pH, UA: 6

## 2015-12-28 NOTE — Telephone Encounter (Signed)
Patient called and stated that she has not felt her baby move since Saturday. Patient is [redacted]wks pregnant. Patient complained of contraction on Sunday. She also i shaving abd pain. Patient stated she went L&D on Saturday. Patient is concern. She is requesting a call back . Her number is [phone]

## 2015-12-28 NOTE — Telephone Encounter (Signed)
Pt to come to office at 11:00 to see Dr. Valentino Saxonherry and that she will be worked in.

## 2015-12-28 NOTE — Progress Notes (Signed)
Problem OB: Patient complains of decreased fetal movement x 3 days and pelvic cramping (thinks they are [employer] Corporation[employer]xton Hicks) x 2 days. Denies bleeding or vaginal discharge. Notes that she usually feels baby move every other day.  Was seen in triage 3 days ago for cramping and pain, was noted to be dehydrated, received IVF.  Given reassurance, discussed that at current gestational age she may not feel baby moving frequently, but will pick up.  Can begin fetal kick counts after 28 weeks. Reassurance given after FHT heard today. Encouraged maintaining adequate hydration, Tylenol prn.

## 2016-01-14 ENCOUNTER — Encounter: Payer: Self-pay | Admitting: Obstetrics and Gynecology

## 2016-01-14 ENCOUNTER — Ambulatory Visit (INDEPENDENT_AMBULATORY_CARE_PROVIDER_SITE_OTHER): Payer: Medicaid Other | Admitting: Obstetrics and Gynecology

## 2016-01-14 VITALS — BP 116/57 | HR 83 | Wt 186.2 lb

## 2016-01-14 DIAGNOSIS — Z3492 Encounter for supervision of normal pregnancy, unspecified, second trimester: Secondary | ICD-10-CM

## 2016-01-14 LAB — POCT URINALYSIS DIPSTICK
BILIRUBIN UA: NEGATIVE
Glucose, UA: NEGATIVE
Ketones, UA: NEGATIVE
Leukocytes, UA: NEGATIVE
NITRITE UA: NEGATIVE
PH UA: 6
Protein, UA: NEGATIVE
RBC UA: NEGATIVE
SPEC GRAV UA: 1.015
Urobilinogen, UA: 0.2

## 2016-01-14 NOTE — Progress Notes (Signed)
ROB- pt is doing well denies any new complaints

## 2016-01-14 NOTE — Progress Notes (Signed)
ROB

## 2016-02-18 ENCOUNTER — Encounter: Payer: Medicaid Other | Admitting: Obstetrics and Gynecology

## 2016-02-18 ENCOUNTER — Ambulatory Visit (INDEPENDENT_AMBULATORY_CARE_PROVIDER_SITE_OTHER): Payer: Medicaid Other | Admitting: Obstetrics and Gynecology

## 2016-02-18 VITALS — BP 105/58 | HR 76 | Wt 190.8 lb

## 2016-02-18 DIAGNOSIS — Z3493 Encounter for supervision of normal pregnancy, unspecified, third trimester: Secondary | ICD-10-CM | POA: Diagnosis not present

## 2016-02-18 DIAGNOSIS — Z23 Encounter for immunization: Secondary | ICD-10-CM

## 2016-02-18 LAB — POCT URINALYSIS DIPSTICK
Bilirubin, UA: NEGATIVE
Blood, UA: NEGATIVE
Glucose, UA: NEGATIVE
KETONES UA: NEGATIVE
Leukocytes, UA: NEGATIVE
Nitrite, UA: NEGATIVE
PH UA: 8
PROTEIN UA: NEGATIVE
SPEC GRAV UA: 1.01
Urobilinogen, UA: 0.2

## 2016-02-18 MED ORDER — TETANUS-DIPHTH-ACELL PERTUSSIS 5-2.5-18.5 LF-MCG/0.5 IM SUSP
0.5000 mL | Freq: Once | INTRAMUSCULAR | Status: AC
  Administered 2016-02-18: 0.5 mL via INTRAMUSCULAR

## 2016-02-18 NOTE — Progress Notes (Signed)
ROB- doing better, considering Nexplanon, circumcison and exclusively breastfeeding, but pumps(doesn't put baby to breast)

## 2016-02-18 NOTE — Progress Notes (Signed)
ROB- pt denies any complaints, wasn't prepared for glucose test today

## 2016-02-18 NOTE — Patient Instructions (Signed)

## 2016-02-22 ENCOUNTER — Other Ambulatory Visit: Payer: Self-pay | Admitting: *Deleted

## 2016-02-22 ENCOUNTER — Other Ambulatory Visit: Payer: Medicaid Other

## 2016-02-22 DIAGNOSIS — Z3493 Encounter for supervision of normal pregnancy, unspecified, third trimester: Secondary | ICD-10-CM

## 2016-02-22 DIAGNOSIS — Z131 Encounter for screening for diabetes mellitus: Secondary | ICD-10-CM

## 2016-02-23 LAB — GLUCOSE, 1 HOUR GESTATIONAL: Gestational Diabetes Screen: 118 mg/dL (ref 65–139)

## 2016-02-23 LAB — HEMOGLOBIN AND HEMATOCRIT, BLOOD
Hematocrit: 33.2 % — ABNORMAL LOW (ref 34.0–46.6)
Hemoglobin: 11.4 g/dL (ref 11.1–15.9)

## 2016-03-10 ENCOUNTER — Ambulatory Visit (INDEPENDENT_AMBULATORY_CARE_PROVIDER_SITE_OTHER): Payer: Medicaid Other | Admitting: Obstetrics and Gynecology

## 2016-03-10 VITALS — BP 107/64 | HR 87 | Wt 192.5 lb

## 2016-03-10 DIAGNOSIS — Z3493 Encounter for supervision of normal pregnancy, unspecified, third trimester: Secondary | ICD-10-CM

## 2016-03-10 LAB — POCT URINALYSIS DIPSTICK
BILIRUBIN UA: NEGATIVE
GLUCOSE UA: NEGATIVE
Ketones, UA: NEGATIVE
Leukocytes, UA: NEGATIVE
NITRITE UA: NEGATIVE
PH UA: 6.5
Protein, UA: NEGATIVE
RBC UA: NEGATIVE
SPEC GRAV UA: 1.01
UROBILINOGEN UA: 0.2

## 2016-03-10 NOTE — Progress Notes (Signed)
ROB- doing well, discussed maternity belt.

## 2016-03-10 NOTE — Progress Notes (Signed)
ROB- pt is having a lot of pelvic pressure 

## 2016-03-24 ENCOUNTER — Ambulatory Visit (INDEPENDENT_AMBULATORY_CARE_PROVIDER_SITE_OTHER): Payer: Medicaid Other | Admitting: Obstetrics and Gynecology

## 2016-03-24 VITALS — BP 98/62 | HR 109 | Wt 194.8 lb

## 2016-03-24 DIAGNOSIS — Z3493 Encounter for supervision of normal pregnancy, unspecified, third trimester: Secondary | ICD-10-CM

## 2016-03-24 LAB — POCT URINALYSIS DIPSTICK
Bilirubin, UA: NEGATIVE
Blood, UA: NEGATIVE
Glucose, UA: NEGATIVE
Ketones, UA: NEGATIVE
LEUKOCYTES UA: NEGATIVE
NITRITE UA: NEGATIVE
PROTEIN UA: NEGATIVE
SPEC GRAV UA: 1.01
UROBILINOGEN UA: 0.2
pH, UA: 6.5

## 2016-03-24 NOTE — Progress Notes (Signed)
ROB

## 2016-03-24 NOTE — Patient Instructions (Signed)
Vaginal Birth After Cesarean Delivery  Vaginal birth after cesarean delivery (VBAC) is giving birth vaginally after previously delivering a baby by a cesarean. In the past, if a woman had a cesarean delivery, all births afterward would be done by cesarean delivery. This is no longer true. It can be safe for the mother to try a vaginal delivery after having a cesarean delivery.   It is important to discuss VBAC with your health care provider early in the pregnancy so you can understand the risks, benefits, and options. It will give you time to decide what is best in your particular case. The final decision about whether to have a VBAC or repeat cesarean delivery should be between you and your health care provider. Any changes in your health or your baby's health during your pregnancy may make it necessary to change your initial decision about VBAC.   WOMEN WHO PLAN TO HAVE A VBAC SHOULD CHECK WITH THEIR HEALTH CARE PROVIDER TO BE SURE THAT:  · The previous cesarean delivery was done with a low transverse uterine cut (incision) (not a vertical classical incision).    · The birth canal is big enough for the baby.    · There were no other operations on the uterus.    · An electronic fetal monitor (EFM) will be on at all times during labor.    · An operating room will be available and ready in case an emergency cesarean delivery is needed.    · A health care provider and surgical nursing staff will be available at all times during labor to be ready to do an emergency delivery cesarean if necessary.    · An anesthesiologist will be present in case an emergency cesarean delivery is needed.    · The nursery is prepared and has adequate personnel and necessary equipment available to care for the baby in case of an emergency cesarean delivery.  BENEFITS OF VBAC  · Shorter stay in the hospital.    · Avoidance of risks associated with cesarean delivery, such as:    Surgical complications, such as opening of the incision or  hernia in the incision.    Injury to other organs.    Fever. This can occur if an infection develops after surgery. It can also occur as a reaction to the medicine given to make you numb during the surgery.  · Less blood loss and need for blood transfusions.  · Lower risk of blood clots and infection.   · Shorter recovery.    · Decreased risk for having to remove the uterus (hysterectomy).    · Decreased risk for the placenta to completely or partially cover the opening of the uterus (placenta previa) with a future pregnancy.    · Decrease risk in future labor and delivery.  RISKS OF A VBAC  · Tearing (rupture) of the uterus. This is occurs in less than 1% of VBACs. The risk of this happening is higher if:    Steps are taken to begin the labor process (induce labor) or stimulate or strengthen contractions (augment labor).      Medicine is used to soften (ripen) the cervix.  · Having to remove the uterus (hysterectomy) if it ruptures.  VBAC SHOULD NOT BE DONE IF:  · The previous cesarean delivery was done with a vertical (classical) or T-shaped incision or you do not know what kind of incision was made.    · You had a ruptured uterus.    · You have had certain types of surgery on your uterus, such as removal of uterine fibroids.   Ask your health care provider about other types of surgeries that prevent you from having a VBAC.  · You have certain medical or childbirth (obstetrical) problems.    · There are problems with the baby.    · You have had two previous cesarean deliveries and no vaginal deliveries.  OTHER FACTS TO KNOW ABOUT VBAC:  · It is safe to have an epidural anesthetic with VBAC.    · It is safe to turn the baby from a breech position (attempt an external cephalic version).    · It is safe to try a VBAC with twins.    · VBAC may not be successful if your baby weights 8.8 lb (4 kg) or more. However, weight predictions are not always accurate and should not be used alone to decide if VBAC is right for  you.  · There is an increased failure rate if the time between the cesarean delivery and VBAC is less than 19 months.    · Your health care provider may advise against a VBAC if you have preeclampsia (high blood pressure, protein in the urine, and swelling of face and extremities).    · VBAC is often successful if you previously gave birth vaginally.    · VBAC is often successful when the labor starts spontaneously before the due date.    · Delivering a baby through a VBAC is similar to having a normal spontaneous vaginal delivery.     This information is not intended to replace advice given to you by your health care provider. Make sure you discuss any questions you have with your health care provider.     Document Released: 12/17/2006 Document Revised: 07/17/2014 Document Reviewed: 01/23/2013  Elsevier Interactive Patient Education ©2016 [employer] Inc[employer]

## 2016-03-24 NOTE — Progress Notes (Signed)
ROB-patient expressed desires for TOLAC (X2); discussed risks associated with uterine rupture and failed labor attempt- will have her sign release for previous op notes and discuss futher with Dr Valentino Saxonherry at next visit. States first c/s for fetal distress at 5cm, then repeat c/s at term with no dilation because on call doctor talked her into it.  Also faint fetal arrythmia noted today, will have her return next week for FHR check.

## 2016-03-29 ENCOUNTER — Ambulatory Visit (INDEPENDENT_AMBULATORY_CARE_PROVIDER_SITE_OTHER): Payer: Medicaid Other | Admitting: Obstetrics and Gynecology

## 2016-03-29 DIAGNOSIS — O368390 Maternal care for abnormalities of the fetal heart rate or rhythm, unspecified trimester, not applicable or unspecified: Secondary | ICD-10-CM

## 2016-03-29 NOTE — Progress Notes (Signed)
Follow-up OB- fetal arrythmia still noted- [facility]

## 2016-03-30 ENCOUNTER — Other Ambulatory Visit: Payer: Self-pay | Admitting: Obstetrics and Gynecology

## 2016-03-30 DIAGNOSIS — O368390 Maternal care for abnormalities of the fetal heart rate or rhythm, unspecified trimester, not applicable or unspecified: Secondary | ICD-10-CM

## 2016-04-10 ENCOUNTER — Ambulatory Visit: Payer: Medicaid Other

## 2016-04-12 ENCOUNTER — Encounter: Payer: Self-pay | Admitting: Obstetrics and Gynecology

## 2016-04-12 ENCOUNTER — Ambulatory Visit (INDEPENDENT_AMBULATORY_CARE_PROVIDER_SITE_OTHER): Payer: Medicaid Other | Admitting: Obstetrics and Gynecology

## 2016-04-12 VITALS — BP 114/78 | HR 80 | Wt 197.0 lb

## 2016-04-12 DIAGNOSIS — Z113 Encounter for screening for infections with a predominantly sexual mode of transmission: Secondary | ICD-10-CM

## 2016-04-12 DIAGNOSIS — Z3483 Encounter for supervision of other normal pregnancy, third trimester: Secondary | ICD-10-CM

## 2016-04-12 DIAGNOSIS — O34219 Maternal care for unspecified type scar from previous cesarean delivery: Secondary | ICD-10-CM

## 2016-04-12 DIAGNOSIS — Z3685 Encounter for antenatal screening for Streptococcus B: Secondary | ICD-10-CM | POA: Diagnosis not present

## 2016-04-12 LAB — POCT URINALYSIS DIPSTICK
Bilirubin, UA: NEGATIVE
Blood, UA: NEGATIVE
Glucose, UA: NEGATIVE
KETONES UA: NEGATIVE
LEUKOCYTES UA: NEGATIVE
Nitrite, UA: NEGATIVE
PH UA: 6
PROTEIN UA: NEGATIVE
SPEC GRAV UA: 1.015
UROBILINOGEN UA: NEGATIVE

## 2016-04-12 NOTE — Progress Notes (Signed)
ROB Consult: Patient notes irregular ctx, not painful. Saw dentist last week, prescribed Amoxicillin, has not yet initiated. 36 week labs done today.    Referred from [facility] for consultation.  Patient with h/o C/S x 2 at term (both for fetal distress), desiring TOLAC.  Furthest cervical dilation was 6 cm.  The following risks were discussed with the patient:  Risk of uterine rupture at term is 0.78 percent with TOLAC and 0.22 percent with ERCD. 1 in 10 uterine ruptures will result in neonatal death or neurological injury. The benefits of a trial of labor after cesarean (TOLAC) resulting in a vaginal birth after cesarean (VBAC) include the following: shorter length of hospital stay and postpartum recovery (in most cases); fewer complications, such as postpartum fever, wound or uterine infection, thromboembolism (blood clots in the leg or lung), need for blood transfusion and fewer neonatal breathing problems. The risks of an attempted VBAC or TOLAC include the following: Risk of failed trial of labor after cesarean (TOLAC) without a vaginal birth after cesarean (VBAC) resulting in repeat cesarean delivery (RCD) in about 20 to 40 percent of women who attempt VBAC.  Risk of rupture of uterus resulting in an emergency cesarean delivery. The risk of uterine rupture may be related in part to the type of uterine incision made during the first cesarean delivery. A previous transverse uterine incision has the lowest risk of rupture (0.2 to 1.5 percent risk). The risk of fetal death is very low with both VBAC and elective repeat cesarean delivery (ERCD), but the likelihood of fetal death is higher with VBAC than with ERCD. Maternal death is very rare with either type of delivery. The risks of an elective repeat cesarean delivery (ERCD) were reviewed with the patient including but not limited to: 08/998 risk of uterine rupture which could have serious consequences, bleeding which may require transfusion; infection which  may require antibiotics; injury to bowel, bladder or other surrounding organs (bowel, bladder, ureters); injury to the fetus; need for additional procedures including hysterectomy in the event of a life-threatening hemorrhage; thromboembolic phenomenon; abnormal placentation; incisional problems; death and other postoperative or anesthesia complications.    These risks and benefits are summarized on the handout given. All her questions answered.   A total of 15 minutes were spent face-to-face with the patient during this encounter and over half of that time dealt with counseling and coordination of care.   Hildred LaserAnika Kaito Schulenburg, MD [facility]

## 2016-04-12 NOTE — Progress Notes (Signed)
Went to dentist for a tooth ache and was given a pill but has not taken it. Calling to find out what it is.

## 2016-04-12 NOTE — Patient Instructions (Signed)
Vaginal Birth After Cesarean Delivery  Vaginal birth after cesarean delivery (VBAC) is giving birth vaginally after previously delivering a baby by a cesarean. In the past, if a woman had a cesarean delivery, all births afterward would be done by cesarean delivery. This is no longer true. It can be safe for the mother to try a vaginal delivery after having a cesarean delivery.   It is important to discuss VBAC with your health care provider early in the pregnancy so you can understand the risks, benefits, and options. It will give you time to decide what is best in your particular case. The final decision about whether to have a VBAC or repeat cesarean delivery should be between you and your health care provider. Any changes in your health or your baby's health during your pregnancy may make it necessary to change your initial decision about VBAC.   WOMEN WHO PLAN TO HAVE A VBAC SHOULD CHECK WITH THEIR HEALTH CARE PROVIDER TO BE SURE THAT:  · The previous cesarean delivery was done with a low transverse uterine cut (incision) (not a vertical classical incision).    · The birth canal is big enough for the baby.    · There were no other operations on the uterus.    · An electronic fetal monitor (EFM) will be on at all times during labor.    · An operating room will be available and ready in case an emergency cesarean delivery is needed.    · A health care provider and surgical nursing staff will be available at all times during labor to be ready to do an emergency delivery cesarean if necessary.    · An anesthesiologist will be present in case an emergency cesarean delivery is needed.    · The nursery is prepared and has adequate personnel and necessary equipment available to care for the baby in case of an emergency cesarean delivery.  BENEFITS OF VBAC  · Shorter stay in the hospital.    · Avoidance of risks associated with cesarean delivery, such as:    Surgical complications, such as opening of the incision or  hernia in the incision.    Injury to other organs.    Fever. This can occur if an infection develops after surgery. It can also occur as a reaction to the medicine given to make you numb during the surgery.  · Less blood loss and need for blood transfusions.  · Lower risk of blood clots and infection.   · Shorter recovery.    · Decreased risk for having to remove the uterus (hysterectomy).    · Decreased risk for the placenta to completely or partially cover the opening of the uterus (placenta previa) with a future pregnancy.    · Decrease risk in future labor and delivery.  RISKS OF A VBAC  · Tearing (rupture) of the uterus. This is occurs in less than 1% of VBACs. The risk of this happening is higher if:    Steps are taken to begin the labor process (induce labor) or stimulate or strengthen contractions (augment labor).      Medicine is used to soften (ripen) the cervix.  · Having to remove the uterus (hysterectomy) if it ruptures.  VBAC SHOULD NOT BE DONE IF:  · The previous cesarean delivery was done with a vertical (classical) or T-shaped incision or you do not know what kind of incision was made.    · You had a ruptured uterus.    · You have had certain types of surgery on your uterus, such as removal of uterine fibroids.   Ask your health care provider about other types of surgeries that prevent you from having a VBAC.  · You have certain medical or childbirth (obstetrical) problems.    · There are problems with the baby.    · You have had two previous cesarean deliveries and no vaginal deliveries.  OTHER FACTS TO KNOW ABOUT VBAC:  · It is safe to have an epidural anesthetic with VBAC.    · It is safe to turn the baby from a breech position (attempt an external cephalic version).    · It is safe to try a VBAC with twins.    · VBAC may not be successful if your baby weights 8.8 lb (4 kg) or more. However, weight predictions are not always accurate and should not be used alone to decide if VBAC is right for  you.  · There is an increased failure rate if the time between the cesarean delivery and VBAC is less than 19 months.    · Your health care provider may advise against a VBAC if you have preeclampsia (high blood pressure, protein in the urine, and swelling of face and extremities).    · VBAC is often successful if you previously gave birth vaginally.    · VBAC is often successful when the labor starts spontaneously before the due date.    · Delivering a baby through a VBAC is similar to having a normal spontaneous vaginal delivery.     This information is not intended to replace advice given to you by your health care provider. Make sure you discuss any questions you have with your health care provider.     Document Released: 12/17/2006 Document Revised: 07/17/2014 Document Reviewed: 01/23/2013  Elsevier Interactive Patient Education ©2016 [employer] Inc[employer]

## 2016-04-13 ENCOUNTER — Ambulatory Visit
Admission: RE | Admit: 2016-04-13 | Discharge: 2016-04-13 | Disposition: A | Discharge status: Home / Self Care | Payer: Medicaid Other | Source: Ambulatory Visit | Attending: Maternal & Fetal Medicine | Admitting: Maternal & Fetal Medicine

## 2016-04-13 ENCOUNTER — Ambulatory Visit
Admission: RE | Admit: 2016-04-13 | Discharge: 2016-04-13 | Disposition: A | Discharge status: Home / Self Care | Payer: Medicaid Other | Source: Ambulatory Visit | Attending: Obstetrics and Gynecology | Admitting: Obstetrics and Gynecology

## 2016-04-13 DIAGNOSIS — Z3483 Encounter for supervision of other normal pregnancy, third trimester: Secondary | ICD-10-CM | POA: Diagnosis not present

## 2016-04-13 DIAGNOSIS — O368390 Maternal care for abnormalities of the fetal heart rate or rhythm, unspecified trimester, not applicable or unspecified: Secondary | ICD-10-CM

## 2016-04-13 DIAGNOSIS — Z3A36 36 weeks gestation of pregnancy: Secondary | ICD-10-CM | POA: Diagnosis not present

## 2016-04-13 DIAGNOSIS — O368330 Maternal care for abnormalities of the fetal heart rate or rhythm, third trimester, not applicable or unspecified: Secondary | ICD-10-CM | POA: Diagnosis present

## 2016-04-14 LAB — GC/CHLAMYDIA PROBE AMP
Chlamydia trachomatis, NAA: NEGATIVE
Neisseria gonorrhoeae by PCR: NEGATIVE

## 2016-04-15 LAB — CULTURE, BETA STREP (GROUP B ONLY): STREP GP B CULTURE: POSITIVE — AB

## 2016-04-18 ENCOUNTER — Ambulatory Visit (INDEPENDENT_AMBULATORY_CARE_PROVIDER_SITE_OTHER): Payer: Medicaid Other | Admitting: Obstetrics and Gynecology

## 2016-04-18 VITALS — BP 134/64 | HR 84 | Wt 194.4 lb

## 2016-04-18 DIAGNOSIS — Z23 Encounter for immunization: Secondary | ICD-10-CM

## 2016-04-18 DIAGNOSIS — Z3493 Encounter for supervision of normal pregnancy, unspecified, third trimester: Secondary | ICD-10-CM

## 2016-04-18 NOTE — Progress Notes (Signed)
ROB- pt is having a lot of pelvic pressure 

## 2016-04-18 NOTE — Progress Notes (Signed)
ROB- doing well, will plan TOLAC, Flu vaccine given

## 2016-04-18 NOTE — Patient Instructions (Signed)
Group B streptococcus (GBS) is a type of bacteria often found in healthy women. GBS is not the same as the bacteria that causes strep throat. You may have GBS in your vagina, rectum, or bladder. GBS does not spread through sexual contact, but it can be passed to a baby during childbirth. This can be dangerous for your baby. It is not dangerous to you and usually does not cause any symptoms. Your health care provider may test you for GBS when your pregnancy is between 35 and 37 weeks. GBS is dangerous only during birth, so there is no need to test for it earlier. It is possible to have GBS during pregnancy and never pass it to your baby. If your test results are positive for GBS, your health care provider may recommend giving you antibiotic medicine during delivery to make sure your baby stays healthy. RISK FACTORS You are more likely to pass GBS to your baby if:   Your water breaks (ruptured membrane) or you go into labor before 37 weeks.  Your water breaks 18 hours before you deliver.  You passed GBS during a previous pregnancy.  You have a urinary tract infection caused by GBS any time during pregnancy.  You have a fever during labor. SYMPTOMS Most women who have GBS do not have any symptoms. If you have a urinary tract infection caused by GBS, you might have frequent or painful urination and fever. Babies who get GBS usually show symptoms within 7 days of birth. Symptoms may include:   Breathing problems.  Heart and blood pressure problems.  Digestive and kidney problems. DIAGNOSIS Routine screening for GBS is recommended for all pregnant women. A health care provider takes a sample of the fluid in your vagina and rectum with a swab. It is then sent to a lab to be checked for GBS. A sample of your urine may also be checked for the bacteria.  TREATMENT If you test positive for GBS, you may need treatment with an antibiotic medicine during labor. As soon as you go into labor, or as soon as  your membranes rupture, you will get the antibiotic medicine through an IV access. You will continue to get the medicine until after you give birth. You do not need antibiotic medicine if you are having a cesarean delivery.If your baby shows signs or symptoms of GBS after birth, your baby can also be treated with an antibiotic medicine. HOME CARE INSTRUCTIONS   Take all antibiotic medicine as prescribed by your health care provider. Only take medicine as directed.   Continue with prenatal visits and care.   Keep all follow-up appointments.  SEEK MEDICAL CARE IF:   You have pain when you urinate.   You have to urinate frequently.   You have a fever.  SEEK IMMEDIATE MEDICAL CARE IF:   Your membranes rupture.  You go into labor.   This information is not intended to replace advice given to you by your health care provider. Make sure you discuss any questions you have with your health care provider.   Document Released: 10/03/2007 Document Revised: 07/01/2013 Document Reviewed: 04/18/2013 Elsevier Interactive Patient Education 2016 Elsevier Inc.  

## 2016-04-28 ENCOUNTER — Ambulatory Visit (INDEPENDENT_AMBULATORY_CARE_PROVIDER_SITE_OTHER): Payer: Medicaid Other | Admitting: Obstetrics and Gynecology

## 2016-04-28 VITALS — BP 107/61 | HR 84 | Wt 194.8 lb

## 2016-04-28 DIAGNOSIS — Z3493 Encounter for supervision of normal pregnancy, unspecified, third trimester: Secondary | ICD-10-CM

## 2016-04-28 LAB — POCT URINALYSIS DIPSTICK
Bilirubin, UA: NEGATIVE
GLUCOSE UA: NEGATIVE
KETONES UA: NEGATIVE
Leukocytes, UA: NEGATIVE
Nitrite, UA: NEGATIVE
Protein, UA: NEGATIVE
RBC UA: NEGATIVE
UROBILINOGEN UA: 0.2
pH, UA: 6

## 2016-04-28 NOTE — Progress Notes (Signed)
ROB- doing well, denies any changes. Labor precautions discussed

## 2016-04-28 NOTE — Progress Notes (Signed)
ROB- pt is having a lot pelvic pressure, some contractions 

## 2016-05-02 ENCOUNTER — Ambulatory Visit (INDEPENDENT_AMBULATORY_CARE_PROVIDER_SITE_OTHER): Payer: Medicaid Other | Admitting: Obstetrics and Gynecology

## 2016-05-02 VITALS — BP 102/61 | HR 83 | Wt 199.4 lb

## 2016-05-02 DIAGNOSIS — Z3483 Encounter for supervision of other normal pregnancy, third trimester: Secondary | ICD-10-CM

## 2016-05-02 DIAGNOSIS — R638 Other symptoms and signs concerning food and fluid intake: Secondary | ICD-10-CM

## 2016-05-02 DIAGNOSIS — O34219 Maternal care for unspecified type scar from previous cesarean delivery: Secondary | ICD-10-CM

## 2016-05-02 LAB — POCT URINALYSIS DIPSTICK
BILIRUBIN UA: NEGATIVE
GLUCOSE UA: NEGATIVE
Ketones, UA: NEGATIVE
Leukocytes, UA: NEGATIVE
NITRITE UA: NEGATIVE
Protein, UA: NEGATIVE
RBC UA: NEGATIVE
Spec Grav, UA: 1.01
Urobilinogen, UA: NEGATIVE
pH, UA: 6.5

## 2016-05-02 NOTE — Progress Notes (Signed)
ROB: Patient doing well, notes bloody show and losing mucus plug over past few days. Labor precautions reiterated. RTC in 1 week if undelivered.

## 2016-05-09 ENCOUNTER — Ambulatory Visit (INDEPENDENT_AMBULATORY_CARE_PROVIDER_SITE_OTHER): Payer: Medicaid Other | Admitting: Obstetrics and Gynecology

## 2016-05-09 ENCOUNTER — Ambulatory Visit (INDEPENDENT_AMBULATORY_CARE_PROVIDER_SITE_OTHER): Payer: Medicaid Other

## 2016-05-09 ENCOUNTER — Ambulatory Visit: Payer: Medicaid Other

## 2016-05-09 VITALS — BP 123/71 | HR 69 | Wt 198.9 lb

## 2016-05-09 DIAGNOSIS — O4100X0 Oligohydramnios, unspecified trimester, not applicable or unspecified: Secondary | ICD-10-CM

## 2016-05-09 DIAGNOSIS — Z3483 Encounter for supervision of other normal pregnancy, third trimester: Secondary | ICD-10-CM | POA: Diagnosis not present

## 2016-05-09 DIAGNOSIS — Z3493 Encounter for supervision of normal pregnancy, unspecified, third trimester: Secondary | ICD-10-CM

## 2016-05-09 DIAGNOSIS — O480 Post-term pregnancy: Secondary | ICD-10-CM

## 2016-05-09 DIAGNOSIS — O34219 Maternal care for unspecified type scar from previous cesarean delivery: Secondary | ICD-10-CM

## 2016-05-09 LAB — POCT URINALYSIS DIPSTICK
Bilirubin, UA: NEGATIVE
Blood, UA: NEGATIVE
Ketones, UA: NEGATIVE
NITRITE UA: NEGATIVE
PROTEIN UA: NEGATIVE
SPEC GRAV UA: 1.015
UROBILINOGEN UA: 0.2
pH, UA: 6.5

## 2016-05-09 NOTE — Progress Notes (Signed)
ROB- growth scan and NST done today, pt is having some contractions, pelvic pressure

## 2016-05-09 NOTE — Progress Notes (Signed)
ROB and postdates visit-  NST performed today was reviewed and was found to be reactive. Baseline 125 with Moderate variability; No decels noted.  U/s findings: Indications:Growth and AFI for post dates Findings:  Singleton intrauterine pregnancy is visualized with FHR at 126 BPM. Biometrics give an (U/S) Gestational age of 23 5/7 weeks and an (U/S) EDD of 05/18/16; this correlates with the clinically established EDD of 05/06/16.  Fetal presentation is Vertex.  EFW: 3665 g (8lb 1 oz) Williams 60 th percentile. Placenta: Posterior, grade 2, remote to cervix.. AFI: 7.1cm oligohydramnios. (5th percentile) .  Impression: 1. 38 5/7 week Viable Singleton Intrauterine pregnancy by U/S. 2. (U/S) EDD is consistent with Clinically established (LMP) EDD of 05/06/16. 3. Adequate Growth.  4. Low AFI for gestational age  Recommendations: 1.Clinical correlation with the patient's History and Physical Exam.   Boyce MediciMaria E Hill  Scan reviewed and agree with findings, discussed with patient at today's visit. Desires planned repeat c/s if not laboring in next 24-36 hours, will plan and discuss with Dr Valentino Saxonherry.  Kaeya Schiffer [place], CNM

## 2016-05-09 NOTE — Progress Notes (Signed)
OB Consult:  Patient for consultation for repeat C-section if labor does not ensue by end of the week.  Currently post-dates pregnancy with h/o C-section x 2, desiring VBAC, now with borderline oligohydramnios (AFI 7 cm). The risks of cesarean section discussed with the patient included but were not limited to: bleeding which may require transfusion or reoperation; infection which may require antibiotics; injury to bowel, bladder, ureters or other surrounding organs; injury to the fetus; need for additional procedures including hysterectomy in the event of a life-threatening hemorrhage; placental abnormalities wth subsequent pregnancies, incisional problems, thromboembolic phenomenon and other postoperative/anesthesia complications.   Patient notes understanding of risks.  Will schedule for 05/12/16 if no labor by this date.

## 2016-05-11 ENCOUNTER — Inpatient Hospital Stay
Admission: EM | Admit: 2016-05-11 | Discharge: 2016-05-14 | DRG: 775 | Disposition: A | Discharge status: Home / Self Care | Payer: Medicaid Other | Attending: Obstetrics and Gynecology | Admitting: Obstetrics and Gynecology

## 2016-05-11 ENCOUNTER — Encounter
Admission: RE | Admit: 2016-05-11 | Discharge: 2016-05-11 | Disposition: A | Discharge status: Home / Self Care | Payer: Medicaid Other | Source: Ambulatory Visit | Attending: Obstetrics and Gynecology | Admitting: Obstetrics and Gynecology

## 2016-05-11 DIAGNOSIS — O99824 Streptococcus B carrier state complicating childbirth: Secondary | ICD-10-CM | POA: Diagnosis present

## 2016-05-11 DIAGNOSIS — R638 Other symptoms and signs concerning food and fluid intake: Secondary | ICD-10-CM

## 2016-05-11 DIAGNOSIS — O34211 Maternal care for low transverse scar from previous cesarean delivery: Secondary | ICD-10-CM | POA: Diagnosis present

## 2016-05-11 DIAGNOSIS — Z3A40 40 weeks gestation of pregnancy: Secondary | ICD-10-CM

## 2016-05-11 DIAGNOSIS — O9962 Diseases of the digestive system complicating childbirth: Secondary | ICD-10-CM | POA: Diagnosis present

## 2016-05-11 DIAGNOSIS — O480 Post-term pregnancy: Secondary | ICD-10-CM | POA: Insufficient documentation

## 2016-05-11 DIAGNOSIS — O34219 Maternal care for unspecified type scar from previous cesarean delivery: Secondary | ICD-10-CM | POA: Diagnosis present

## 2016-05-11 DIAGNOSIS — K219 Gastro-esophageal reflux disease without esophagitis: Secondary | ICD-10-CM | POA: Diagnosis present

## 2016-05-11 HISTORY — DX: Gastro-esophageal reflux disease without esophagitis: K21.9

## 2016-05-11 LAB — CBC
HCT: 34.3 % — ABNORMAL LOW (ref 35.0–47.0)
Hemoglobin: 11.5 g/dL — ABNORMAL LOW (ref 12.0–16.0)
MCH: 26.9 pg (ref 26.0–34.0)
MCHC: 33.4 g/dL (ref 32.0–36.0)
MCV: 80.4 fL (ref 80.0–100.0)
PLATELETS: 227 10*3/uL (ref 150–440)
RBC: 4.27 MIL/uL (ref 3.80–5.20)
RDW: 13.3 % (ref 11.5–14.5)
WBC: 6.5 10*3/uL (ref 3.6–11.0)

## 2016-05-11 LAB — RAPID HIV SCREEN (HIV 1/2 AB+AG)
HIV 1/2 ANTIBODIES: NONREACTIVE
HIV-1 P24 ANTIGEN - HIV24: NONREACTIVE

## 2016-05-11 LAB — TYPE AND SCREEN
ABO/RH(D): A POS
Antibody Screen: NEGATIVE
Extend sample reason: UNDETERMINED

## 2016-05-11 MED ORDER — CEFOTETAN DISODIUM 2 G IJ SOLR
2.0000 g | INTRAMUSCULAR | Status: DC
  Filled 2016-05-11: qty 2

## 2016-05-11 NOTE — OB Triage Note (Signed)
Patient came in tonight c/o contractions that began at 1900. Pt reports they were 10 mins apart. Now contractions are 5 mins apart per patient. Denies LOF, denies vaginal bleeding, reports positive FM. Pt mentioned she would like to VBAC if in labor. Patient currently on monitors and assessing. Patient in stable condition.

## 2016-05-11 NOTE — Patient Instructions (Signed)
  Your procedure is scheduled on: 05/12/16 Fri Report to Labor and Delivery 3rd floor medical mall at 10:45am   Remember: Instructions that are not followed completely may result in serious medical risk, up to and including death, or upon the discretion of your surgeon and anesthesiologist your surgery may need to be rescheduled.    _x___ 1. Do not eat food or drink liquids after midnight. No gum chewing or hard candies.     __x__ 2. No Alcohol for 24 hours before or after surgery.   __x__3. No Smoking for 24 prior to surgery.   ____  4. Bring all medications with you on the day of surgery if instructed.    __x__ 5. Notify your doctor if there is any change in your medical condition     (cold, fever, infections).     Do not wear jewelry, make-up, hairpins, clips or nail polish.  Do not wear lotions, powders, or perfumes. You may wear deodorant.  Do not shave 48 hours prior to surgery. Men may shave face and neck.  Do not bring valuables to the hospital.    [facility] for any belongings or valuables.               Contacts, dentures or bridgework may not be worn into surgery.  Leave your suitcase in the car. After surgery it may be brought to your room.  For patients admitted to the hospital, discharge time is determined by your treatment team.   Patients discharged the day of surgery will not be allowed to drive home.    Please read over the following fact sheets that you were given:   [facility] Preparing for Surgery and or MRSA Information   _x___ Take these medicines the morning of surgery with A SIP OF WATER:    1. None  2.  3.  4.  5.  6.  ____Fleets enema or Magnesium Citrate as directed.   _x___ Use CHG Soap or sage wipes as directed on instruction sheet   ____ Use inhalers on the day of surgery and bring to hospital day of surgery  ____ Stop metformin 2 days prior to surgery    ____ Take 1/2 of usual insulin dose the night before surgery  and none on the morning of           surgery.   ____ Stop aspirin or coumadin, or plavix  x__ Stop Anti-inflammatories such as Advil, Aleve, Ibuprofen, Motrin, Naproxen,          Naprosyn, Goodies powders or aspirin products. Ok to take Tylenol.   ____ Stop supplements until after surgery.    ____ Bring C-Pap to the hospital.

## 2016-05-12 ENCOUNTER — Inpatient Hospital Stay: Payer: Medicaid Other | Admitting: Certified Registered"

## 2016-05-12 ENCOUNTER — Encounter
Admission: EM | Disposition: A | Discharge status: Home / Self Care | Payer: Self-pay | Source: Home / Self Care | Attending: Obstetrics and Gynecology

## 2016-05-12 ENCOUNTER — Inpatient Hospital Stay
Admission: RE | Admit: 2016-05-12 | Payer: Medicaid Other | Source: Ambulatory Visit | Admitting: Obstetrics and Gynecology

## 2016-05-12 DIAGNOSIS — O99824 Streptococcus B carrier state complicating childbirth: Secondary | ICD-10-CM | POA: Diagnosis present

## 2016-05-12 DIAGNOSIS — Z3A41 41 weeks gestation of pregnancy: Secondary | ICD-10-CM | POA: Diagnosis not present

## 2016-05-12 DIAGNOSIS — Z3483 Encounter for supervision of other normal pregnancy, third trimester: Secondary | ICD-10-CM | POA: Diagnosis present

## 2016-05-12 DIAGNOSIS — K219 Gastro-esophageal reflux disease without esophagitis: Secondary | ICD-10-CM | POA: Diagnosis present

## 2016-05-12 DIAGNOSIS — Z3493 Encounter for supervision of normal pregnancy, unspecified, third trimester: Secondary | ICD-10-CM | POA: Diagnosis not present

## 2016-05-12 DIAGNOSIS — O34211 Maternal care for low transverse scar from previous cesarean delivery: Secondary | ICD-10-CM | POA: Diagnosis present

## 2016-05-12 DIAGNOSIS — O34219 Maternal care for unspecified type scar from previous cesarean delivery: Secondary | ICD-10-CM | POA: Diagnosis present

## 2016-05-12 DIAGNOSIS — Z3A40 40 weeks gestation of pregnancy: Secondary | ICD-10-CM | POA: Diagnosis not present

## 2016-05-12 DIAGNOSIS — O9962 Diseases of the digestive system complicating childbirth: Secondary | ICD-10-CM | POA: Diagnosis present

## 2016-05-12 LAB — CBC
HEMATOCRIT: 36.1 % (ref 35.0–47.0)
Hemoglobin: 12.2 g/dL (ref 12.0–16.0)
MCH: 27.4 pg (ref 26.0–34.0)
MCHC: 33.8 g/dL (ref 32.0–36.0)
MCV: 80.9 fL (ref 80.0–100.0)
PLATELETS: 239 10*3/uL (ref 150–440)
RBC: 4.46 MIL/uL (ref 3.80–5.20)
RDW: 13.2 % (ref 11.5–14.5)
WBC: 7.4 10*3/uL (ref 3.6–11.0)

## 2016-05-12 LAB — RPR: RPR Ser Ql: NONREACTIVE

## 2016-05-12 LAB — ABO/RH: ABO/RH(D): A POS

## 2016-05-12 SURGERY — Surgical Case
Anesthesia: Spinal

## 2016-05-12 MED ORDER — LACTATED RINGERS IV SOLN: INTRAVENOUS | Status: DC

## 2016-05-12 MED ORDER — SODIUM CHLORIDE FLUSH 0.9 % IV SOLN
INTRAVENOUS | Status: AC
  Filled 2016-05-12: qty 20

## 2016-05-12 MED ORDER — LIDOCAINE-EPINEPHRINE (PF) 1.5 %-1:200000 IJ SOLN
INTRAMUSCULAR | Status: DC | PRN
  Administered 2016-05-12: 3 mL via EPIDURAL

## 2016-05-12 MED ORDER — FENTANYL 2.5 MCG/ML W/ROPIVACAINE 0.2% IN NS 100 ML EPIDURAL INFUSION (ARMC-ANES)
EPIDURAL | Status: AC
  Filled 2016-05-12: qty 100

## 2016-05-12 MED ORDER — OXYTOCIN 40 UNITS IN LACTATED RINGERS INFUSION - SIMPLE MED
INTRAVENOUS | Status: AC
  Administered 2016-05-12: 1 m[IU]/min via INTRAVENOUS
  Filled 2016-05-12: qty 1000

## 2016-05-12 MED ORDER — ACETAMINOPHEN 325 MG PO TABS: 650.0000 mg | ORAL_TABLET | ORAL | Status: DC | PRN

## 2016-05-12 MED ORDER — LACTATED RINGERS IV SOLN
500.0000 mL | INTRAVENOUS | Status: DC | PRN
  Administered 2016-05-12: 400 mL via INTRAVENOUS
  Administered 2016-05-12: 100 mL via INTRAVENOUS

## 2016-05-12 MED ORDER — LIDOCAINE HCL (PF) 1 % IJ SOLN: 30.0000 mL | INTRAMUSCULAR | Status: DC | PRN

## 2016-05-12 MED ORDER — SODIUM CHLORIDE 0.9 % IV SOLN
1.0000 g | INTRAVENOUS | Status: DC
  Administered 2016-05-12 (×3): 1 g via INTRAVENOUS
  Filled 2016-05-12 (×3): qty 1000

## 2016-05-12 MED ORDER — OXYTOCIN 40 UNITS IN LACTATED RINGERS INFUSION - SIMPLE MED: 2.5000 [IU]/h | INTRAVENOUS | Status: DC

## 2016-05-12 MED ORDER — SODIUM CHLORIDE 0.9 % IV SOLN
INTRAVENOUS | Status: AC
  Administered 2016-05-12: 2 g via INTRAVENOUS
  Filled 2016-05-12: qty 2000

## 2016-05-12 MED ORDER — BUPIVACAINE HCL (PF) 0.25 % IJ SOLN
INTRAMUSCULAR | Status: DC | PRN
  Administered 2016-05-12: 5 mL via EPIDURAL

## 2016-05-12 MED ORDER — OXYTOCIN BOLUS FROM INFUSION: 500.0000 mL | Freq: Once | INTRAVENOUS | Status: DC

## 2016-05-12 MED ORDER — OXYTOCIN 40 UNITS IN LACTATED RINGERS INFUSION - SIMPLE MED
1.0000 m[IU]/min | INTRAVENOUS | Status: DC
  Administered 2016-05-12: 1 m[IU]/min via INTRAVENOUS

## 2016-05-12 MED ORDER — FENTANYL 2.5 MCG/ML W/ROPIVACAINE 0.2% IN NS 100 ML EPIDURAL INFUSION (ARMC-ANES)
EPIDURAL | Status: DC | PRN
  Administered 2016-05-12: 9 mL/h via EPIDURAL

## 2016-05-12 MED ORDER — TERBUTALINE SULFATE 1 MG/ML IJ SOLN
0.2500 mg | Freq: Once | INTRAMUSCULAR | Status: DC | PRN
  Filled 2016-05-12: qty 1

## 2016-05-12 MED ORDER — LIDOCAINE HCL (PF) 1 % IJ SOLN
INTRAMUSCULAR | Status: DC | PRN
  Administered 2016-05-12: 3 mL via SUBCUTANEOUS

## 2016-05-12 MED ORDER — SODIUM CHLORIDE 0.9 % IV SOLN
2.0000 g | Freq: Once | INTRAVENOUS | Status: AC
  Administered 2016-05-12: 2 g via INTRAVENOUS

## 2016-05-12 MED ORDER — LACTATED RINGERS IV SOLN
INTRAVENOUS | Status: DC
  Administered 2016-05-12: 06:00:00 via INTRAVENOUS

## 2016-05-12 MED ORDER — SOD CITRATE-CITRIC ACID 500-334 MG/5ML PO SOLN: 30.0000 mL | ORAL | Status: DC | PRN

## 2016-05-12 MED ORDER — ONDANSETRON HCL 4 MG/2ML IJ SOLN: 4.0000 mg | Freq: Four times a day (QID) | INTRAMUSCULAR | Status: DC | PRN

## 2016-05-12 MED ORDER — FENTANYL CITRATE (PF) 100 MCG/2ML IJ SOLN
50.0000 ug | INTRAMUSCULAR | Status: DC | PRN
  Administered 2016-05-12: 100 ug via INTRAVENOUS
  Administered 2016-05-12: 50 ug via INTRAVENOUS
  Filled 2016-05-12 (×2): qty 2

## 2016-05-12 MED ORDER — HYDROCODONE-ACETAMINOPHEN 5-325 MG PO TABS
ORAL_TABLET | ORAL | Status: AC
  Filled 2016-05-12: qty 1

## 2016-05-12 SURGICAL SUPPLY — 23 items
BAG COUNTER SPONGE EZ (MISCELLANEOUS) ×2 IMPLANT
CANISTER SUCT 3000ML (MISCELLANEOUS) ×3 IMPLANT
CHLORAPREP W/TINT 26ML (MISCELLANEOUS) ×6 IMPLANT
COUNTER SPONGE BAG EZ (MISCELLANEOUS) ×1
DRSG TELFA 3X8 NADH (GAUZE/BANDAGES/DRESSINGS) ×3 IMPLANT
ELECT REM PT RETURN 9FT ADLT (ELECTROSURGICAL) ×3
ELECTRODE REM PT RTRN 9FT ADLT (ELECTROSURGICAL) ×1 IMPLANT
GAUZE SPONGE 4X4 12PLY STRL (GAUZE/BANDAGES/DRESSINGS) ×3 IMPLANT
GLOVE BIO SURGEON STRL SZ 6 (GLOVE) ×3 IMPLANT
GLOVE BIOGEL PI IND STRL 6.5 (GLOVE) ×1 IMPLANT
GLOVE BIOGEL PI INDICATOR 6.5 (GLOVE) ×2
GOWN STRL REUS W/ TWL LRG LVL3 (GOWN DISPOSABLE) ×2 IMPLANT
GOWN STRL REUS W/TWL LRG LVL3 (GOWN DISPOSABLE) ×4
KIT RM TURNOVER STRD PROC AR (KITS) ×3 IMPLANT
NS IRRIG 1000ML POUR BTL (IV SOLUTION) ×3 IMPLANT
PACK C SECTION AR (MISCELLANEOUS) ×3 IMPLANT
PAD OB MATERNITY 4.3X12.25 (PERSONAL CARE ITEMS) ×3 IMPLANT
PAD PREP 24X41 OB/GYN DISP (PERSONAL CARE ITEMS) ×3 IMPLANT
SUT MNCRL AB 4-0 PS2 18 (SUTURE) ×3 IMPLANT
SUT PLAIN 2 0 XLH (SUTURE) IMPLANT
SUT VIC AB 0 CT1 36 (SUTURE) ×12 IMPLANT
SUT VIC AB 3-0 SH 27 (SUTURE) ×2
SUT VIC AB 3-0 SH 27X BRD (SUTURE) ×1 IMPLANT

## 2016-05-12 NOTE — Anesthesia Preprocedure Evaluation (Signed)
Anesthesia Evaluation  Patient identified by MRN, date of birth, ID band Patient awake    Reviewed: Allergy & Precautions, H&P , NPO status , Patient's Chart, lab work & pertinent test results  Airway Mallampati: III  TM Distance: >3 FB Neck ROM: full    Dental  (+) Chipped   Pulmonary neg pulmonary ROS,    Pulmonary exam normal        Cardiovascular negative cardio ROS Normal cardiovascular exam     Neuro/Psych  Headaches, negative psych ROS   GI/Hepatic Neg liver ROS, GERD  ,  Endo/Other  negative endocrine ROS  Renal/GU negative Renal ROS  negative genitourinary   Musculoskeletal   Abdominal   Peds  Hematology negative hematology ROS (+)   Anesthesia Other Findings   Reproductive/Obstetrics (+) Pregnancy                             Anesthesia Physical Anesthesia Plan  ASA: II  Anesthesia Plan: Epidural   Post-op Pain Management:    Induction:   Airway Management Planned:   Additional Equipment:   Intra-op Plan:   Post-operative Plan:   Informed Consent: I have reviewed the patients History and Physical, chart, labs and discussed the procedure including the risks, benefits and alternatives for the proposed anesthesia with the patient or authorized representative who has indicated his/her understanding and acceptance.     Plan Discussed with: CRNA and Anesthesiologist  Anesthesia Plan Comments:         Anesthesia Quick Evaluation

## 2016-05-12 NOTE — Progress Notes (Signed)
Sabrina Little is a 25 y.o. [phone] at 6651w6d by LMP admitted for active labor  Subjective: Reports intermittent pressure with contractions  Objective: BP 129/75   Pulse 90   Temp 98.5 F (36.9 C) (Oral)   Resp 18   Ht 5\' 2"  (1.575 m)   Wt 198 lb (89.8 kg)   LMP 07/31/2015 (Exact Date)   BMI 36.21 kg/m  No intake/output data recorded. No intake/output data recorded.  FHT:  FHR: 125 bpm, variability: moderate,  accelerations:  Present,  decelerations:  Absent UC:   irregular, every 2-4 minutes on 3511mu/min pitocin SVE:   4.5/80/-2, AROM with moderate amount clear fluid- IUPC placed along with foley. Labs: Lab Results  Component Value Date   WBC 7.4 05/12/2016   HGB 12.2 05/12/2016   HCT 36.1 05/12/2016   MCV 80.9 05/12/2016   PLT 239 05/12/2016    Assessment / Plan: Augmentation of labor, progressing well  Labor: Progressing on Pitocin, will continue to increase then AROM Preeclampsia:  labs stable Fetal Wellbeing:  Category I Pain Control:  Epidural I/D:  n/a Anticipated MOD:  hopeful for SVD  [name] 05/12/2016, 7:20 PM

## 2016-05-12 NOTE — Progress Notes (Signed)
At 0910 Discussed nipple stimulation with patient, patient states, "I am aware and yes I'll try." Pt. Performed nipple stim for 15 min., currently walking around in room, birthing ball offered and given.  Heat pad offered for lower back discomfort, pt. Said, "maybe later".

## 2016-05-12 NOTE — Progress Notes (Signed)
Sabrina Little is a 25 y.o. [phone] at 9266w6d by LMP admitted for active labor  Subjective: Feels pressure with contractions  Objective: BP (!) 107/50   Pulse 76   Temp 98.6 F (37 C)   Resp 20   Ht 5\' 2"  (1.575 m)   Wt 198 lb (89.8 kg)   LMP 07/31/2015 (Exact Date)   BMI 36.21 kg/m  No intake/output data recorded. No intake/output data recorded.  Fetal Wellbeing:  Category I UC:   regular, every 2 minutes on 7415mu/min pitocin SVE:   Dilation: 10 Effacement (%): 90 Station: -2, -1 Exam by:: Hurshel PartyE. Rivera, RN   Labs: Lab Results  Component Value Date   WBC 7.4 05/12/2016   HGB 12.2 05/12/2016   HCT 36.1 05/12/2016   MCV 80.9 05/12/2016   PLT 239 05/12/2016    Assessment / Plan: Augmentation of labor, progressing well  Labor: Progressing normally, pushing x 30 minutes with good progress Preeclampsia:  labs stable Pain Control:  Epidural Anticipated MOD:  NSVD  Belford Pascucci Suzan Nailer Taryn Nave, CNM 05/12/2016, 10:32 PM

## 2016-05-12 NOTE — Anesthesia Procedure Notes (Signed)
Epidural Patient location during procedure: OB  Staffing Anesthesiologist: Naomie DeanKEPHART, WILLIAM K Resident/CRNA: Mathews ArgyleLOGAN, Ashlie Mcmenamy Performed: resident/CRNA   Preanesthetic Checklist Completed: patient identified, site marked, surgical consent, pre-op evaluation, timeout performed, IV checked, risks and benefits discussed and monitors and equipment checked  Epidural Patient position: sitting Prep: ChloraPrep and site prepped and draped Patient monitoring: heart rate, continuous pulse ox and blood pressure Approach: midline Location: L3-L4 Injection technique: LOR saline  Needle:  Needle type: Tuohy  Needle gauge: 17 G Needle length: 9 cm and 9 Needle insertion depth: 6 cm Catheter type: closed end flexible Catheter size: 19 Gauge Catheter at skin depth: 11 cm Test dose: negative and 1.5% lidocaine with Epi 1:200 K  Assessment Events: blood not aspirated, injection not painful, no injection resistance, negative IV test and no paresthesia  Additional Notes   Patient tolerated the insertion well without complications.Reason for block:procedure for pain

## 2016-05-12 NOTE — Progress Notes (Signed)
Sabrina Little is a 25 y.o. [phone] at 4577w6d by LMP admitted for active labor  Subjective: States contractions are still painful but spaced out since stopped walking  Objective: BP 114/70 (BP Location: Right Arm)   Pulse 72   Temp 98 F (36.7 C)   Resp 18   Ht 5\' 2"  (1.575 m)   Wt 198 lb (89.8 kg)   LMP 07/31/2015 (Exact Date)   BMI 36.21 kg/m  No intake/output data recorded. No intake/output data recorded.  FHT:  FHR: 135 bpm, variability: moderate,  accelerations:  Present,  decelerations:  Absent UC:   irregular, every 10 minutes for last hour SVE:   Dilation: 2.5 Effacement (%): 60, 70 Station: -3 Exam by:: Sabrina CorporationMillner, RN  Labs: Lab Results  Component Value Date   WBC 6.5 05/11/2016   HGB 11.5 (L) 05/11/2016   HCT 34.3 (L) 05/11/2016   MCV 80.4 05/11/2016   PLT 227 05/11/2016    Assessment / Plan: early labor with TOLAC  Labor: progressing slowly Preeclampsia:  labs stable Fetal Wellbeing:  Category I Pain Control:  Labor support without medications I/D:  n/a Anticipated MOD:  Hopeful for VBAC  Sabrina Little 05/12/2016, 9:07 AM

## 2016-05-12 NOTE — H&P (Signed)
Obstetric History and Physical  Sabrina Little is a 25 y.o. [phone] with IUP at 4194w6d presenting with irregular contractions tonight that have gotten stronger. Patient states she has been having  irregular, every 4-6 minutes contractions, none vaginal bleeding, intact membranes, with active fetal movement.    [facility]  Pregnancy complications or risks:previous LTCS x2, desired TOLAC, but scheduled for repeat c/s today if labor had not started before then.  Prenatal labs and studies: ABO, Rh: --/--/A POS (11/02 0915) Antibody: NEG (11/02 0915) Rubella: 4.67 (05/10 1009) RPR: Non Reactive (05/10 1009)  HBsAg: Negative (05/10 1009)  HIV: Non Reactive (05/10 1009)  GBS:  1 hr Glucola  normal Genetic screening normal Anatomy US normal  Past Medical History:  Diagnosis Date  . GERD (gastroesophageal reflux disease)   . Medical history non-contributory     Past Surgical History:  Procedure Laterality Date  . CESAREAN SECTION     x2  . TONSILLECTOMY  2009    OB History  Gravida Para Term Preterm AB Living  5 2 2   2 2   SAB TAB Ectopic Multiple Live Births  2       2    # Outcome Date GA Lbr Len/2nd Weight Sex Delivery Anes PTL Lv  5 Current           4 Term 2016   7 lb 2.2 oz (3.239 kg) M CS-LTranv   LIV  3 SAB 2015          2 Term 2014   7 lb 2.1 oz (3.234 kg) M CS-LTranv   LIV  1 SAB 2013              Social History   Social History  . Marital status: Married    Spouse name: N/A  . Number of children: N/A  . Years of education: N/A   Social History Main Topics  . Smoking status: Never Smoker  . Smokeless tobacco: Never Used  . Alcohol use No  . Drug use: No  . Sexual activity: Yes    Birth control/ protection: None   Other Topics Concern  . None   Social History Narrative  . None    Family History  Problem Relation Age of Onset  . Cancer Neg Hx   . Diabetes Neg Hx   . Heart disease Neg Hx     Prescriptions Prior to  Admission  Medication Sig Dispense Refill Last Dose  . acetaminophen (TYLENOL) 325 MG tablet Take 650 mg by mouth every 6 (six) hours as needed for moderate pain or headache.   05/10/2016 at Unknown time  . Prenatal Vit-Fe Fumarate-FA (MULTIVITAMIN-PRENATAL) 27-0.8 MG TABS tablet Take 1 tablet by mouth daily at 12 noon.   05/11/2016 at Unknown time    No Known Allergies  Review of Systems: Negative except for what is mentioned in HPI.  Physical Exam: BP 135/73 (BP Location: Right Arm)   Pulse 71   Temp 97.8 F (36.6 C) (Oral)   Resp 18   Ht 5\' 2"  (1.575 m)   Wt 198 lb (89.8 kg)   LMP 07/31/2015 (Exact Date)   BMI 36.21 kg/m  GENERAL: Well-developed, well-nourished female in no acute distress.  LUNGS: Clear to auscultation bilaterally.  HEART: Regular rate and rhythm. ABDOMEN: Soft, nontender, nondistended, gravid. EXTREMITIES: Nontender, no edema, 2+ distal pulses. Cervical Exam: Dilation: 1.5 Effacement (%): 60 Cervical Position: Posterior Station: -3 Exam by:: Hurshel PartyE. Rivera, RN  FHT:  Baseline rate 135 bpm   Variability moderate  Accelerations present   Decelerations none Contractions: Every 4-6 mins, mild to moderate, patient ambulating and talking easily with them.   Pertinent Labs/Studies:   Results for orders placed or performed during the hospital encounter of 05/11/16 (from the past 24 hour(s))  CBC     Status: Abnormal   Collection Time: 05/11/16  9:15 AM  Result Value Ref Range   WBC 6.5 3.6 - 11.0 K/uL   RBC 4.27 3.80 - 5.20 MIL/uL   Hemoglobin 11.5 (L) 12.0 - 16.0 g/dL   HCT 29.534.3 (L) 62.135.0 - 30.847.0 %   MCV 80.4 80.0 - 100.0 fL   MCH 26.9 26.0 - 34.0 pg   MCHC 33.4 32.0 - 36.0 g/dL   RDW 65.713.3 84.611.5 - 96.214.5 %   Platelets 227 150 - 440 K/uL  Rapid HIV screen (HIV 1/2 Ab+Ag)     Status: None   Collection Time: 05/11/16  9:15 AM  Result Value Ref Range   HIV-1 P24 Antigen - HIV24 NON REACTIVE NON REACTIVE   HIV 1/2 Antibodies NON REACTIVE NON REACTIVE   Interpretation  (HIV Ag Ab)      A non reactive test result means that HIV 1 or HIV 2 antibodies and HIV 1 p24 antigen were not detected in the specimen.  Type and screen     Status: None   Collection Time: 05/11/16  9:15 AM  Result Value Ref Range   ABO/RH(D) A POS    Antibody Screen NEG    Sample Expiration 05/14/2016    Extend sample reason PREGNANT WITHIN 3 MONTHS, UNABLE TO EXTEND     Assessment : Sabrina Little is a 25 y.o. [id number] at 7320w6d being admitted for labor evaluation.  Plan: Labor: Expectant management.  Induction/Augmentation as needed, per protocol FWB: Reassuring fetal heart tracing.  GBS positive Delivery plan: Hopeful for vaginal delivery  Tinley Rought, CNM [facility]

## 2016-05-12 NOTE — Progress Notes (Signed)
Sabrina Little is a 25 y.o. [id number] at 2921w6d by LMP admitted for active labor  Subjective: Reports moderate pain when she has a contraction, but they have spaced out. Still request trial for labor.  Objective: BP 101/61 (BP Location: Left Arm)   Pulse 93   Temp 97.9 F (36.6 C) (Oral)   Resp 17   Ht 5\' 2"  (1.575 m)   Wt 198 lb (89.8 kg)   LMP 07/31/2015 (Exact Date)   BMI 36.21 kg/m  No intake/output data recorded. No intake/output data recorded.  FHT:  FHR: 128 bpm, variability: moderate,  accelerations:  Present,  decelerations:  Absent UC:   irregular, every 7-10 minutes, mild to moderate to palpation SVE:  3.5/80/-3 Labs: Lab Results  Component Value Date   WBC 6.5 05/11/2016   HGB 11.5 (L) 05/11/2016   HCT 34.3 (L) 05/11/2016   MCV 80.4 05/11/2016   PLT 227 05/11/2016    Assessment / Plan: Protracted latent phase, discussed options at this time: Expectant management, pitocin augmentation, or proceeding with repeat c/s: patient desires pitocin augmentation. Will discuss with Dr Valentino Saxonherry and proceed per policy.  Labor: prodromal early labor, TOLAC Preeclampsia:  labs stable Fetal Wellbeing:  Category I Pain Control:  Labor support without medications I/D:  n/a Anticipated MOD:  hopeful for SVD  [employer] Micro[employer]Melody N Shambley, CNM 05/12/2016, 12:55 PM

## 2016-05-13 LAB — CHLAMYDIA/NGC RT PCR (ARMC ONLY)
CHLAMYDIA TR: NOT DETECTED
N GONORRHOEAE: NOT DETECTED

## 2016-05-13 LAB — CBC
HCT: 32.7 % — ABNORMAL LOW (ref 35.0–47.0)
Hemoglobin: 11 g/dL — ABNORMAL LOW (ref 12.0–16.0)
MCH: 27.2 pg (ref 26.0–34.0)
MCHC: 33.7 g/dL (ref 32.0–36.0)
MCV: 80.7 fL (ref 80.0–100.0)
PLATELETS: 205 10*3/uL (ref 150–440)
RBC: 4.04 MIL/uL (ref 3.80–5.20)
RDW: 13.3 % (ref 11.5–14.5)
WBC: 11 10*3/uL (ref 3.6–11.0)

## 2016-05-13 MED ORDER — ONDANSETRON HCL 4 MG/2ML IJ SOLN: 4.0000 mg | INTRAMUSCULAR | Status: DC | PRN

## 2016-05-13 MED ORDER — ONDANSETRON HCL 4 MG PO TABS: 4.0000 mg | ORAL_TABLET | ORAL | Status: DC | PRN

## 2016-05-13 MED ORDER — ACETAMINOPHEN 325 MG PO TABS: 650.0000 mg | ORAL_TABLET | ORAL | Status: DC | PRN

## 2016-05-13 MED ORDER — LIDOCAINE 5 % EX PTCH: 1.0000 | MEDICATED_PATCH | CUTANEOUS | Status: DC

## 2016-05-13 MED ORDER — OXYCODONE HCL 5 MG PO TABS
10.0000 mg | ORAL_TABLET | ORAL | Status: DC | PRN
  Administered 2016-05-13 – 2016-05-14 (×2): 10 mg via ORAL
  Filled 2016-05-13 (×2): qty 2

## 2016-05-13 MED ORDER — DIPHENHYDRAMINE HCL 25 MG PO CAPS: 25.0000 mg | ORAL_CAPSULE | Freq: Four times a day (QID) | ORAL | Status: DC | PRN

## 2016-05-13 MED ORDER — SIMETHICONE 80 MG PO CHEW: 80.0000 mg | CHEWABLE_TABLET | ORAL | Status: DC | PRN

## 2016-05-13 MED ORDER — DOCUSATE SODIUM 100 MG PO CAPS
100.0000 mg | ORAL_CAPSULE | Freq: Two times a day (BID) | ORAL | Status: DC
  Administered 2016-05-13 – 2016-05-14 (×3): 100 mg via ORAL
  Filled 2016-05-13 (×3): qty 1

## 2016-05-13 MED ORDER — LACTATED RINGERS IV SOLN: INTRAVENOUS | Status: DC

## 2016-05-13 MED ORDER — IBUPROFEN 600 MG PO TABS
ORAL_TABLET | ORAL | Status: AC
  Administered 2016-05-13: 600 mg via ORAL
  Filled 2016-05-13: qty 1

## 2016-05-13 MED ORDER — IBUPROFEN 600 MG PO TABS
600.0000 mg | ORAL_TABLET | Freq: Four times a day (QID) | ORAL | Status: DC
  Administered 2016-05-13 – 2016-05-14 (×6): 600 mg via ORAL
  Filled 2016-05-13 (×5): qty 1

## 2016-05-13 MED ORDER — WITCH HAZEL-GLYCERIN EX PADS: 1.0000 "application " | MEDICATED_PAD | CUTANEOUS | Status: DC | PRN

## 2016-05-13 MED ORDER — OXYCODONE HCL 5 MG PO TABS
5.0000 mg | ORAL_TABLET | ORAL | Status: DC | PRN
  Administered 2016-05-13 (×2): 5 mg via ORAL
  Filled 2016-05-13 (×2): qty 1

## 2016-05-13 MED ORDER — PRENATAL MULTIVITAMIN CH
1.0000 | ORAL_TABLET | Freq: Every day | ORAL | Status: DC
  Administered 2016-05-13: 1 via ORAL
  Filled 2016-05-13: qty 1

## 2016-05-13 MED ORDER — BENZOCAINE-MENTHOL 20-0.5 % EX AERO
1.0000 "application " | INHALATION_SPRAY | CUTANEOUS | Status: DC | PRN
  Filled 2016-05-13 (×2): qty 56

## 2016-05-13 MED ORDER — DIBUCAINE 1 % RE OINT: 1.0000 "application " | TOPICAL_OINTMENT | RECTAL | Status: DC | PRN

## 2016-05-13 MED ORDER — SENNOSIDES-DOCUSATE SODIUM 8.6-50 MG PO TABS
2.0000 | ORAL_TABLET | ORAL | Status: DC
  Administered 2016-05-13 – 2016-05-14 (×2): 2 via ORAL
  Filled 2016-05-13 (×2): qty 2

## 2016-05-13 MED ORDER — COCONUT OIL OIL: 1.0000 "application " | TOPICAL_OIL | Status: DC | PRN

## 2016-05-13 NOTE — Anesthesia Postprocedure Evaluation (Signed)
Anesthesia Post Note  Patient: Johnnette Gourdlexis K Elderkin  Procedure(s) Performed: * No procedures listed *  Patient location during evaluation: Mother Baby Anesthesia Type: Epidural Level of consciousness: awake and alert and oriented Pain management: pain level controlled Vital Signs Assessment: post-procedure vital signs reviewed and stable Respiratory status: spontaneous breathing Cardiovascular status: blood pressure returned to baseline Postop Assessment: no headache (patient states she has some back pain when she stood up once, but has no pain laying down.  hasn't been up again.  I told her to keep an eye on it and to contact anesthesia if it persists.  Denies anything going down her legs or leg pain.) Anesthetic complications: no    Last Vitals:  Vitals:   05/13/16 1153 05/13/16 1636  BP: 113/60 107/60  Pulse: 71 67  Resp: 20 17  Temp: 36.6 C 36.7 C    Last Pain:  Vitals:   05/13/16 1636  TempSrc: Oral  PainSc:                  Carin Hockrisson,  Suprina Mandeville B

## 2016-05-13 NOTE — Progress Notes (Signed)
Post Partum Day 1 Subjective: no complaints, up ad lib and voiding  Objective: Blood pressure 107/60, pulse 67, temperature 98.1 F (36.7 C), temperature source Oral, resp. rate 17, height 5\' 2"  (1.575 m), weight 198 lb (89.8 kg), last menstrual period 07/31/2015, SpO2 99 %, unknown if currently breastfeeding.  Physical Exam:  General: alert, cooperative and appears stated age Lochia: appropriate Uterine Fundus: firm Incision: NA DVT Evaluation: No evidence of DVT seen on physical exam. Negative Homan's sign.   Recent Labs  05/12/16 1400 05/13/16 0634  HGB 12.2 11.0*  HCT 36.1 32.7*    Assessment/Plan: Plan for discharge tomorrow Infant feeding ; Both    LOS: 1 day   Sabrina Little 05/13/2016, 5:00 PM

## 2016-05-14 MED ORDER — VITAMIN D3 125 MCG (5000 UT) PO CAPS: 1.0000 | ORAL_CAPSULE | Freq: Every day | ORAL | 2 refills | Status: AC

## 2016-05-14 MED ORDER — IBUPROFEN 600 MG PO TABS: 600.0000 mg | ORAL_TABLET | Freq: Four times a day (QID) | ORAL | 0 refills | Status: AC

## 2016-05-14 MED ORDER — DOCUSATE SODIUM 100 MG PO CAPS: 100.0000 mg | ORAL_CAPSULE | Freq: Two times a day (BID) | ORAL | 0 refills | Status: AC

## 2016-05-14 NOTE — Discharge Summary (Signed)
OB Discharge Summary     Patient Name: Sabrina Little DOB: [birth year] MRN: [id number]  Date of admission: 05/11/2016 Delivering MD: Purcell NailsSHAMBLEY, Esabella Stockinger N   Date of discharge: 05/14/2016  Admitting diagnosis: repeat c section Intrauterine pregnancy: [id number]     Secondary diagnosis:  Active Problems:   Declines VBAC (vaginal birth after cesarean) trial  Additional problems: none     Discharge diagnosis: VBAC                                                                                                Post partum procedures:NA  Augmentation: AROM and Pitocin  Complications: None  Hospital course:  Onset of Labor With Vaginal Delivery     25 y.o. yo [id number] at 7687w6d was admitted in Latent Labor on 05/11/2016. Patient had an uncomplicated labor course as follows:  Membrane Rupture Time/Date: 7:15 PM ,05/12/2016   Intrapartum Procedures: Episiotomy:                                           Lacerations:  3rd degree [4]  Patient had a delivery of a Viable infant. 05/12/2016  Information for the patient's newborn:  Janeece FittingBergmann, Boy Beyonce [[id number]][[id number]]  Delivery Method: Vag-Spont    Pateint had an uncomplicated postpartum course.  She is ambulating, tolerating a regular diet, passing flatus, and urinating well. Patient is discharged home in stable condition on 05/14/16.    Physical exam Vitals:   05/13/16 1636 05/13/16 1954 05/13/16 2318 05/14/16 0742  BP: 107/60 (!) 110/58 123/78 114/65  Pulse: 67 80 79 74  Resp: 17 18 18 14   Temp: 98.1 F (36.7 C) 98.6 F (37 C) 98 F (36.7 C) 97.7 F (36.5 C)  TempSrc: Oral Oral Oral Oral  SpO2:  99% 98% 100%  Weight:      Height:       General: alert, cooperative and no distress Lochia: appropriate Uterine Fundus: firm Incision: N/A DVT Evaluation: No evidence of DVT seen on physical exam. Negative Homan's sign. Labs: Lab Results  Component Value Date   WBC 11.0 05/13/2016   HGB 11.0 (L) 05/13/2016   HCT 32.7 (L) 05/13/2016   MCV 80.7 05/13/2016   PLT 205 05/13/2016   CMP Latest Ref Rng & Units 12/25/2015  Glucose 65 - 99 mg/dL 536(U100(H)  BUN 6 - 20 mg/dL 8  Creatinine 4.400.44 - 3.471.00 mg/dL 4.250.53  Sodium 956135 - 387145 mmol/L 135  Potassium 3.5 - 5.1 mmol/L 3.5  Chloride 101 - 111 mmol/L 104  CO2 22 - 32 mmol/L 20(L)  Calcium 8.9 - 10.3 mg/dL 5.6(E8.7(L)  Total Protein 6.5 - 8.1 g/dL 7.3  Total Bilirubin 0.3 - 1.2 mg/dL 0.3  Alkaline Phos 38 - 126 U/L 92  AST 15 - 41 U/L 21  ALT 14 - 54 U/L 12(L)    Discharge instruction: per After Visit Summary and "Baby and Me Booklet".  After visit meds: PNV, Motrin, Vit D3, Colace  Diet: routine diet  Activity: Advance  as tolerated. Pelvic rest for 6 weeks.   Outpatient follow up:6 weeks Follow up Appt:No future appointments. Follow up Visit:No Follow-up on file.  Postpartum contraception: Nexplanon  Newborn Data: Live born female  Birth Weight: 7 lb 9.3 oz (3440 g) APGAR: 1, 8  Baby Feeding: Bottle and Breast Disposition:home with mother   05/14/2016 Purcell NailsMelody N Jamori Biggar, CNM

## 2016-05-14 NOTE — Progress Notes (Signed)
Discharge instructions given. Patient verbalizes understanding of teaching. Patient discharged home at 1400.

## 2016-06-15 ENCOUNTER — Encounter: Payer: Self-pay | Admitting: Obstetrics and Gynecology

## 2016-06-15 ENCOUNTER — Ambulatory Visit (INDEPENDENT_AMBULATORY_CARE_PROVIDER_SITE_OTHER): Payer: Medicaid Other | Admitting: Obstetrics and Gynecology

## 2016-06-15 VITALS — BP 100/67 | HR 68 | Ht 62.0 in | Wt 173.0 lb

## 2016-06-15 DIAGNOSIS — Z30017 Encounter for initial prescription of implantable subdermal contraceptive: Secondary | ICD-10-CM

## 2016-06-15 MED ORDER — ETONOGESTREL 68 MG ~~LOC~~ IMPL: 68.0000 mg | DRUG_IMPLANT | Freq: Once | SUBCUTANEOUS | Status: AC

## 2016-06-15 NOTE — Progress Notes (Signed)
Sabrina Little is a 25 y.o. year old Asian female here for Nexplanon insertion.  No LMP recorded., last sexual intercourse was before delivery of infant 5 weeks ago, and her pregnancy test today was negative.  Risks/benefits/side effects of Nexplanon have been discussed and her questions have been answered.  Specifically, a failure rate of 07/998 has been reported, with an increased failure rate if pt takes St. John's Wort and/or antiseizure medicaitons.  Sabrina Little is aware of the common side effect of irregular bleeding, which the incidence of decreases over time.  BP 100/67   Pulse 68   Ht 5\' 2"  (1.575 m)   Wt 173 lb (78.5 kg)   Breastfeeding? No   BMI 31.64 kg/m   No results found for this or any previous visit (from the past 24 hour(s)).   She is right-handed, so her left arm, approximately 4 inches proximal from the elbow, was cleansed with alcohol and anesthetized with 2cc of 2% Lidocaine.  The area was cleansed again with betadine and the Nexplanon was inserted per manufacturer's recommendations without difficulty.  A steri-strip and pressure bandage were applied.  Pt was instructed to keep the area clean and dry, remove pressure bandage in 24 hours, and keep insertion site covered with the steri-strip for 3-5 days.  Back up contraception was recommended for 2 weeks.  She was given a card indicating date Nexplanon was inserted and date it needs to be removed. Follow-up PRN problems.  Darleth Eustache,CNM

## 2016-06-28 ENCOUNTER — Encounter: Payer: Self-pay | Admitting: Obstetrics and Gynecology

## 2016-06-28 ENCOUNTER — Ambulatory Visit (INDEPENDENT_AMBULATORY_CARE_PROVIDER_SITE_OTHER): Payer: Medicaid Other | Admitting: Obstetrics and Gynecology

## 2016-06-28 NOTE — Progress Notes (Signed)
  Subjective:     Sabrina Little is a 25 y.o. female who presents for a postpartum visit. She is 6 weeks postpartum following a spontaneous vaginal delivery. I have fully reviewed the prenatal and intrapartum course. The delivery was at 40 gestational weeks. Outcome: vaginal birth after cesarean (VBAC). Anesthesia: epidural. Postpartum course has been uncomplicated. Baby's course has been uncomplicated. Baby is feeding by formula. Bleeding moderate lochia. Bowel function is normal. Bladder function is normal. Patient is not sexually active. Contraception method is abstinence. Postpartum depression screening: negative.  The following portions of the patient's history were reviewed and updated as appropriate: allergies, current medications, past family history, past medical history, past social history, past surgical history and problem list.  Review of Systems A comprehensive review of systems was negative.   Objective:    BP 118/80   Pulse 71   Wt 168 lb 11.2 oz (76.5 kg)   LMP 06/25/2016   Breastfeeding? No   BMI 30.86 kg/m   General:  alert, cooperative and appears stated age   Breasts:  inspection negative, no nipple discharge or bleeding, no masses or nodularity palpable  Lungs: clear to auscultation bilaterally  Heart:  regular rate and rhythm, S1, S2 normal, no murmur, click, rub or gallop  Abdomen: soft, non-tender; bowel sounds normal; no masses,  no organomegaly   Vulva:  normal  Vagina: normal vagina, no discharge, exudate, lesion, or erythema  Cervix:  multiparous appearance  Corpus: normal size, contour, position, consistency, mobility, non-tender  Adnexa:  normal adnexa and no mass, fullness, tenderness  Rectal Exam: no masses, tenderness, nodules        Assessment:     6 weeks postpartum exam. Pap smear not done at today's visit.   Plan:    1. Contraception: Nexplanon  3. Follow up in: 4 months for AE or as needed.

## 2016-06-28 NOTE — Patient Instructions (Signed)
  Place postpartum visit patient instructions here.  

## 2017-04-05 IMAGING — CR DG CHEST 2V
1 series · 2 of 2 positions shown · non-contrast
Comparison: None.

CLINICAL DATA: Severe shortness of breath.  Cold symptoms recently.

EXAM:
CHEST  2 VIEW

[Series 1: w chest pa · 0.14mm/px · 2 of 2 slices shown]
[im 1/2]
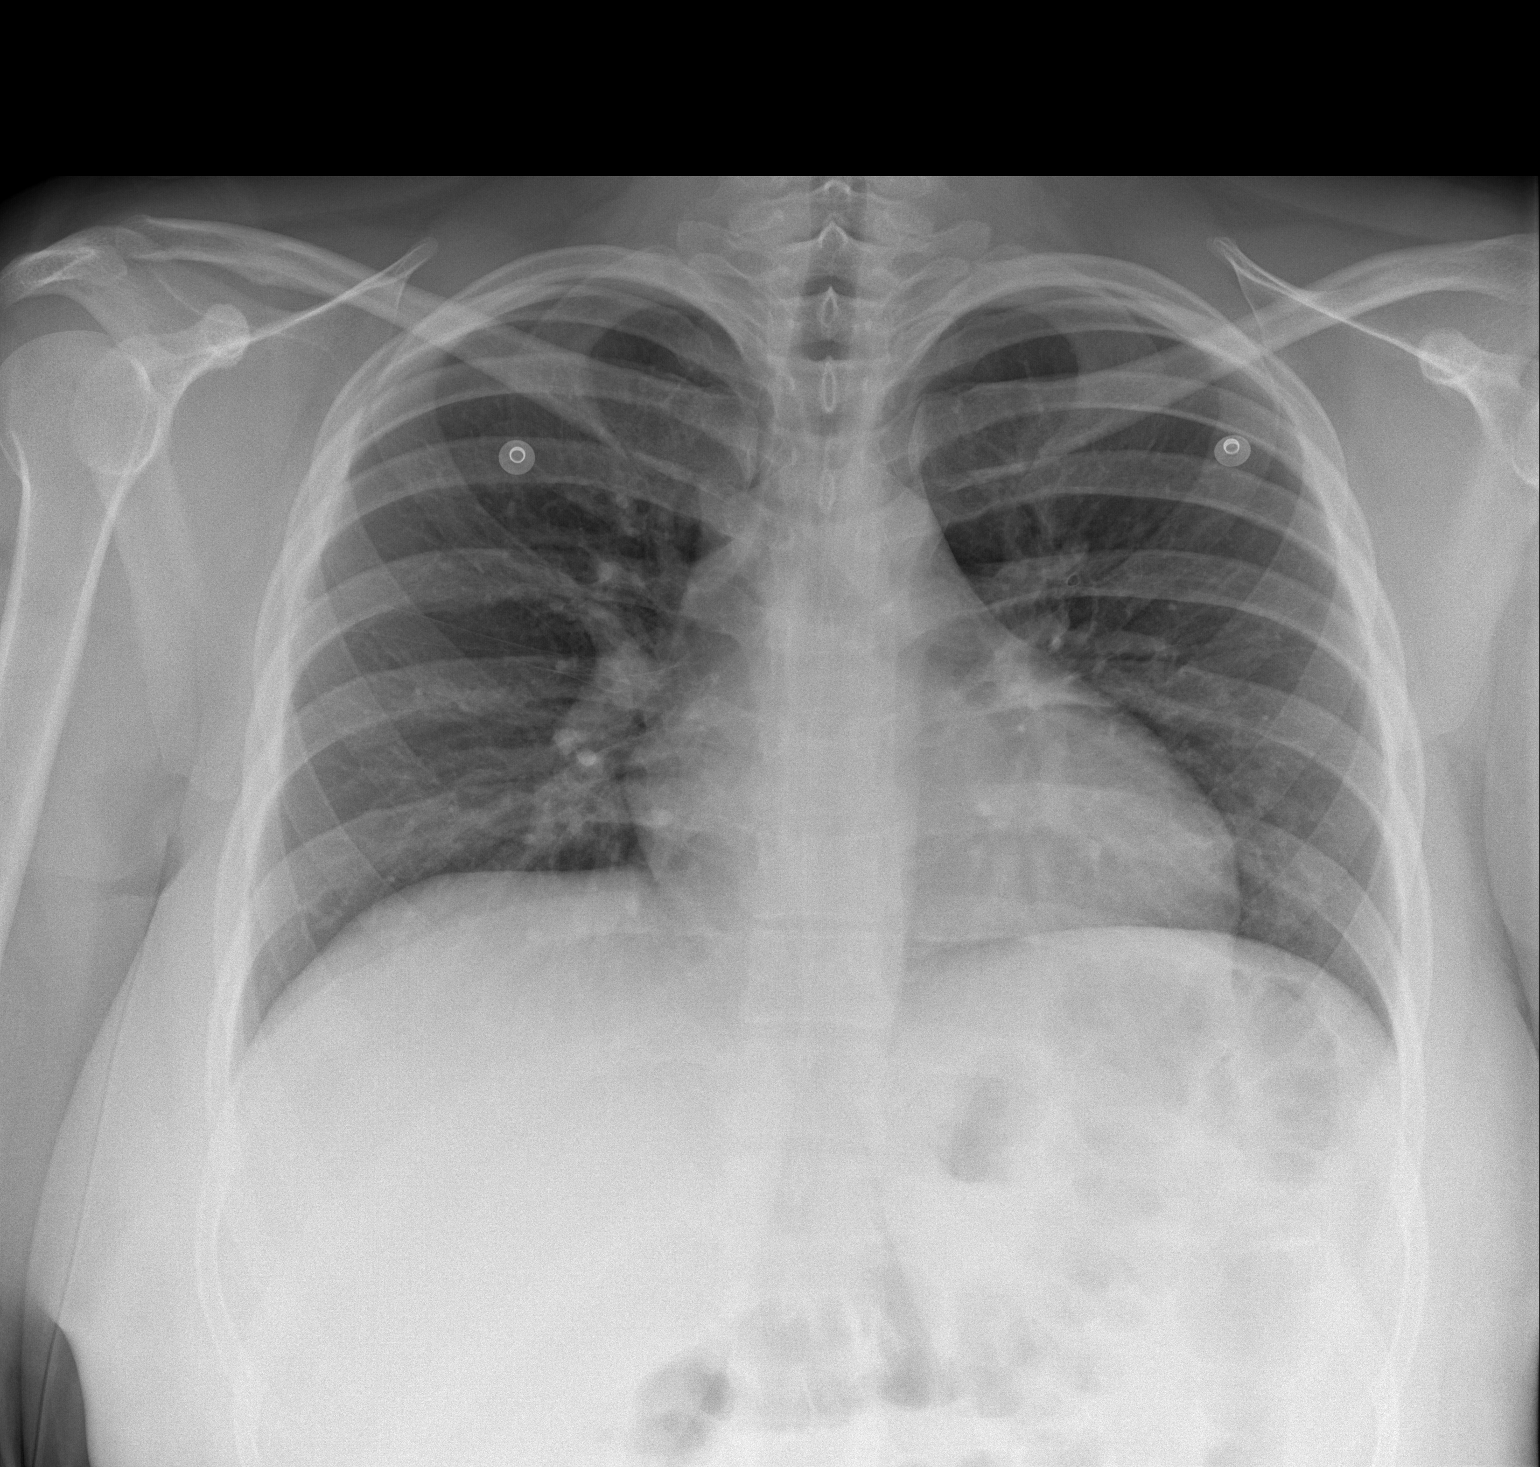
[im 2/2]
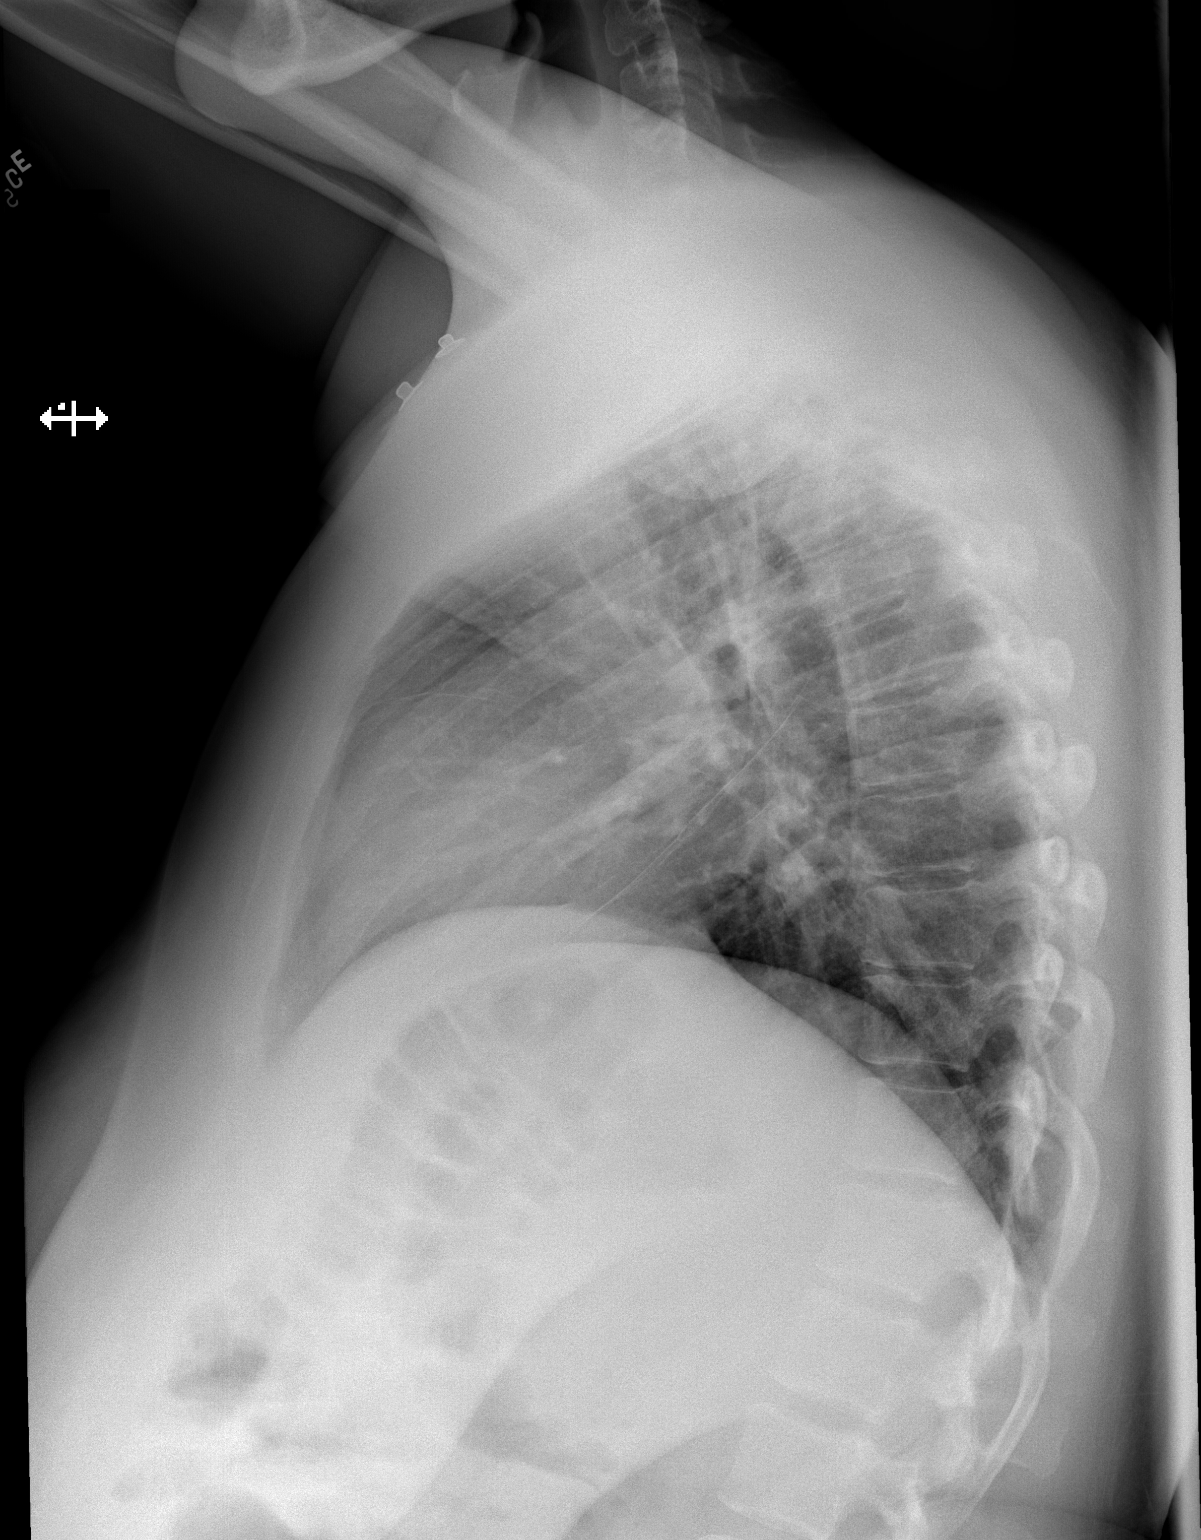

[2 of 2 positions shown; findings below may reference images not displayed]

FINDINGS: Lungs are hypoinflated without consolidation or effusion. No
pneumothorax. Cardiomediastinal silhouette, bones and soft tissues
are within normal.
IMPRESSION: Hypoinflation without acute cardiopulmonary disease.
